# Patient Record
Sex: Female | Born: 1985 | Race: White | Hispanic: No | Marital: Married | Smoking: Never smoker
Health system: Southern US, Community
[De-identification: ages and names within clinical notes are randomized; demographics above are authoritative.]

## PROBLEM LIST (undated history)

## (undated) ENCOUNTER — Inpatient Hospital Stay (HOSPITAL_COMMUNITY): Payer: Self-pay

## (undated) DIAGNOSIS — Z789 Other specified health status: Secondary | ICD-10-CM

## (undated) DIAGNOSIS — E282 Polycystic ovarian syndrome: Secondary | ICD-10-CM

## (undated) DIAGNOSIS — E669 Obesity, unspecified: Secondary | ICD-10-CM

## (undated) DIAGNOSIS — K760 Fatty (change of) liver, not elsewhere classified: Secondary | ICD-10-CM

## (undated) HISTORY — PX: WISDOM TOOTH EXTRACTION: SHX21

## (undated) HISTORY — PX: KNEE SURGERY: SHX244

## (undated) HISTORY — PX: APPENDECTOMY: SHX54

## (undated) HISTORY — PX: DILATION AND CURETTAGE OF UTERUS: SHX78

---

## 2012-01-31 ENCOUNTER — Emergency Department (HOSPITAL_COMMUNITY): Payer: 59

## 2012-01-31 ENCOUNTER — Emergency Department (HOSPITAL_COMMUNITY)
Admission: EM | Admit: 2012-01-31 | Discharge: 2012-02-01 | Disposition: A | Discharge status: Home / Self Care | Payer: 59 | Attending: Emergency Medicine | Admitting: Emergency Medicine

## 2012-01-31 ENCOUNTER — Encounter (HOSPITAL_COMMUNITY): Payer: Self-pay | Admitting: Emergency Medicine

## 2012-01-31 DIAGNOSIS — R10811 Right upper quadrant abdominal tenderness: Secondary | ICD-10-CM | POA: Insufficient documentation

## 2012-01-31 DIAGNOSIS — R112 Nausea with vomiting, unspecified: Secondary | ICD-10-CM | POA: Insufficient documentation

## 2012-01-31 DIAGNOSIS — R109 Unspecified abdominal pain: Secondary | ICD-10-CM

## 2012-01-31 LAB — URINALYSIS, ROUTINE W REFLEX MICROSCOPIC
Glucose, UA: NEGATIVE mg/dL
Ketones, ur: NEGATIVE mg/dL
pH: 5.5 (ref 5.0–8.0)

## 2012-01-31 LAB — CBC WITH DIFFERENTIAL/PLATELET
Eosinophils Relative: 1 % (ref 0–5)
HCT: 41.6 % (ref 36.0–46.0)
Lymphocytes Relative: 25 % (ref 12–46)
Lymphs Abs: 2.5 10*3/uL (ref 0.7–4.0)
MCH: 29.8 pg (ref 26.0–34.0)
MCV: 83.2 fL (ref 78.0–100.0)
Monocytes Absolute: 0.6 10*3/uL (ref 0.1–1.0)
Monocytes Relative: 6 % (ref 3–12)
RBC: 5 MIL/uL (ref 3.87–5.11)
WBC: 10.1 10*3/uL (ref 4.0–10.5)

## 2012-01-31 LAB — URINE MICROSCOPIC-ADD ON

## 2012-01-31 LAB — BASIC METABOLIC PANEL
CO2: 30 mEq/L (ref 19–32)
Chloride: 101 mEq/L (ref 96–112)
Glucose, Bld: 85 mg/dL (ref 70–99)
Sodium: 140 mEq/L (ref 135–145)

## 2012-01-31 MED ORDER — ONDANSETRON HCL 4 MG/2ML IJ SOLN
4.0000 mg | Freq: Once | INTRAMUSCULAR | Status: AC
  Administered 2012-01-31: 4 mg via INTRAVENOUS
  Filled 2012-01-31: qty 2

## 2012-01-31 MED ORDER — MORPHINE SULFATE 4 MG/ML IJ SOLN
4.0000 mg | Freq: Once | INTRAMUSCULAR | Status: AC
  Administered 2012-01-31: 4 mg via INTRAVENOUS
  Filled 2012-01-31: qty 1

## 2012-01-31 MED ORDER — FAMOTIDINE IN NACL 20-0.9 MG/50ML-% IV SOLN
20.0000 mg | Freq: Once | INTRAVENOUS | Status: AC
  Administered 2012-01-31: 20 mg via INTRAVENOUS
  Filled 2012-01-31: qty 50

## 2012-01-31 NOTE — ED Provider Notes (Addendum)
History     CSN: [id number]  Arrival date & time 01/31/12  2002   First MD Initiated Contact with Patient 01/31/12 2220      Chief Complaint  Patient presents with  . Abdominal Pain    (Consider location/radiation/quality/duration/timing/severity/associated sxs/prior treatment) HPI Comments: Pt comes in with cc of abd pain, RUQ. Pt had intermittent pain in the RUQ for the past 2 days, but today the pain has ben constant, and is radiating to the back She has no medical hx, and there is hx of appendectomy. The pain is sharp and throbbing and there is associated nausea and emesis. There is no hx of alcohol abuse. Also, there is no hx of gastritis. There are several family members with cholelithiasic in the family. Pt is not pregnant.  Patient is a 26 y.o. female presenting with abdominal pain. The history is provided by the patient.  Abdominal Pain The primary symptoms of the illness include abdominal pain, nausea and vomiting. The primary symptoms of the illness do not include shortness of breath, diarrhea, dysuria, vaginal discharge or vaginal bleeding.  Symptoms associated with the illness do not include constipation or hematuria.    History reviewed. No pertinent past medical history.  Past Surgical History  Procedure Date  . Appendectomy   . Knee surgery   . Wisdom tooth extraction     No family history on file.  History  Substance Use Topics  . Smoking status: Never Smoker   . Smokeless tobacco: Not on file  . Alcohol Use: No    OB History    Grav Para Term Preterm Abortions TAB SAB Ect Mult Living                  Review of Systems  Constitutional: Positive for activity change.  HENT: Negative for facial swelling and neck pain.   Respiratory: Negative for cough, shortness of breath and wheezing.   Cardiovascular: Negative for chest pain.  Gastrointestinal: Positive for nausea, vomiting and abdominal pain. Negative for diarrhea, constipation, blood in stool and  abdominal distention.  Genitourinary: Negative for dysuria, hematuria, vaginal bleeding, vaginal discharge and difficulty urinating.  Skin: Negative for color change.  Neurological: Negative for speech difficulty and headaches.  Hematological: Does not bruise/bleed easily.  Psychiatric/Behavioral: Negative for confusion.    Allergies  Review of patient's allergies indicates no known allergies.  Home Medications   Current Outpatient Rx  Name Route Sig Dispense Refill  . ACETAMINOPHEN-CODEINE #3 300-30 MG PO TABS Oral Take 1 tablet by mouth every 4 (four) hours as needed. For pain    . BISMUTH SUBSALICYLATE 262 MG/15ML PO SUSP Oral Take 30 mLs by mouth every 6 (six) hours as needed. For nausea      BP 92/56  Pulse 61  Temp 98 F (36.7 C) (Oral)  Resp 18  SpO2 99%  LMP 01/29/2012  Physical Exam  Constitutional: She is oriented to person, place, and time. She appears well-developed and well-nourished.  HENT:  Head: Normocephalic and atraumatic.  Eyes: Conjunctivae and EOM are normal. Pupils are equal, round, and reactive to light.  Neck: Normal range of motion. Neck supple.  Cardiovascular: Normal rate, regular rhythm, normal heart sounds and intact distal pulses.   No murmur heard. Pulmonary/Chest: Effort normal and breath sounds normal. No respiratory distress. She has no wheezes.  Abdominal: Soft. Bowel sounds are normal. She exhibits no distension. There is tenderness. There is no rebound and no guarding.       RUQ  tenderness with voluntary guarding  Genitourinary: Vagina normal and uterus normal.       External exam - normal, no lesions Speculum exam: Pt has someno discharge, + blood per cervix Bimanual exam: Patient has no CMT, no adnexal tenderness or fullness and cervical os is less than a finger width open  Neurological: She is alert and oriented to person, place, and time.  Skin: Skin is warm and dry.    ED Course  Procedures (including critical care time)  Labs  Reviewed  URINALYSIS, ROUTINE W REFLEX MICROSCOPIC - Abnormal; Notable for the following:    Color, Urine AMBER (*)  BIOCHEMICALS MAY BE AFFECTED BY COLOR   APPearance CLOUDY (*)     Hgb urine dipstick LARGE (*)     Leukocytes, UA SMALL (*)     All other components within normal limits  BASIC METABOLIC PANEL - Abnormal; Notable for the following:    GFR calc non Af Amer 81 (*)     All other components within normal limits  URINE MICROSCOPIC-ADD ON - Abnormal; Notable for the following:    Squamous Epithelial / LPF FEW (*)     All other components within normal limits  CBC WITH DIFFERENTIAL  APTT  BASIC METABOLIC PANEL  CBC WITH DIFFERENTIAL  HEPATIC FUNCTION PANEL  PROTIME-INR  LIPASE, BLOOD  PREGNANCY, URINE   No results found.   No diagnosis found.    MDM  DDx includes: Pancreatitis Hepatobiliary pathology including cholecystitis Gastritis/PUD SBO ACS syndrome Aortic Dissection Renal stones  A/P: Pt comes in with cc of RUQ pain. Pt's exam is consistent with cholelithiasis and so is the hx - as the pain is reproduced by po intake and there is associated nausea ans emesis.  We will get [place] RUQ. Pt has some back pain radiating from the RUQ, if the [place] is negative, we will consider getting a CT renal stone protocol.    Derwood Kaplan, MD 02/01/12 2956  Derwood Kaplan, MD 02/01/12 2130

## 2012-01-31 NOTE — ED Notes (Signed)
PT. REPORTS RUQ ABDOMINAL PAIN WITH VOMITTING AND DIARRHEA , SLIGHT CHILLS ONSET,LAST Saturday .

## 2012-02-01 LAB — BASIC METABOLIC PANEL
BUN: 8 mg/dL (ref 6–23)
Calcium: 10 mg/dL (ref 8.4–10.5)
Chloride: 100 mEq/L (ref 96–112)
GFR calc non Af Amer: 83 mL/min — ABNORMAL LOW (ref >90)
Potassium: 3.8 mEq/L (ref 3.5–5.1)
Sodium: 140 mEq/L (ref 135–145)

## 2012-02-01 LAB — PROTIME-INR
INR: 0.9 (ref 0.00–1.49)
Prothrombin Time: 12.3 seconds (ref 11.6–15.2)

## 2012-02-01 LAB — APTT: aPTT: 31 seconds (ref 24–37)

## 2012-02-01 LAB — HEPATIC FUNCTION PANEL
AST: 21 U/L (ref 0–37)
Alkaline Phosphatase: 87 U/L (ref 39–117)
Bilirubin, Direct: <0.1 mg/dL (ref 0.0–0.3)
Total Bilirubin: 0.2 mg/dL — ABNORMAL LOW (ref 0.3–1.2)

## 2012-02-01 MED ORDER — HYDROCODONE-ACETAMINOPHEN 5-325 MG PO TABS: 1.0000 | ORAL_TABLET | ORAL | Status: AC | PRN

## 2012-02-01 MED ORDER — ONDANSETRON HCL 4 MG PO TABS: 8.0000 mg | ORAL_TABLET | Freq: Four times a day (QID) | ORAL | Status: AC

## 2012-02-01 MED ORDER — MORPHINE SULFATE 4 MG/ML IJ SOLN
4.0000 mg | Freq: Once | INTRAMUSCULAR | Status: AC
  Administered 2012-02-01: 4 mg via INTRAVENOUS
  Filled 2012-02-01: qty 1

## 2012-02-01 MED ORDER — IBUPROFEN 600 MG PO TABS: 600.0000 mg | ORAL_TABLET | Freq: Four times a day (QID) | ORAL | Status: AC | PRN

## 2012-02-01 MED ORDER — ONDANSETRON HCL 4 MG/2ML IJ SOLN
4.0000 mg | Freq: Once | INTRAMUSCULAR | Status: AC
  Administered 2012-02-01: 4 mg via INTRAVENOUS
  Filled 2012-02-01: qty 2

## 2012-02-01 NOTE — ED Notes (Signed)
Patient transported to X-ray 

## 2012-07-30 ENCOUNTER — Ambulatory Visit (HOSPITAL_COMMUNITY)
Admission: RE | Admit: 2012-07-30 | Discharge: 2012-07-30 | Disposition: A | Discharge status: Home / Self Care | Payer: 59 | Source: Ambulatory Visit | Attending: Obstetrics & Gynecology | Admitting: Obstetrics & Gynecology

## 2012-07-30 LAB — ABO/RH: ABO/RH(D): O NEG

## 2012-08-08 ENCOUNTER — Encounter (HOSPITAL_COMMUNITY): Payer: Self-pay | Admitting: *Deleted

## 2012-08-08 ENCOUNTER — Inpatient Hospital Stay (HOSPITAL_COMMUNITY): Payer: 59

## 2012-08-08 ENCOUNTER — Inpatient Hospital Stay (HOSPITAL_COMMUNITY)
Admission: AD | Admit: 2012-08-08 | Discharge: 2012-08-09 | Disposition: A | Discharge status: Home / Self Care | Payer: 59 | Source: Ambulatory Visit | Attending: Obstetrics and Gynecology | Admitting: Obstetrics and Gynecology

## 2012-08-08 DIAGNOSIS — O209 Hemorrhage in early pregnancy, unspecified: Secondary | ICD-10-CM | POA: Insufficient documentation

## 2012-08-08 DIAGNOSIS — O200 Threatened abortion: Secondary | ICD-10-CM

## 2012-08-08 HISTORY — DX: Other specified health status: Z78.9

## 2012-08-08 LAB — CBC WITH DIFFERENTIAL/PLATELET
Basophils Absolute: 0 K/uL (ref 0.0–0.1)
Basophils Relative: 0 % (ref 0–1)
Eosinophils Absolute: 0.1 K/uL (ref 0.0–0.7)
Eosinophils Relative: 1 % (ref 0–5)
HCT: 36.4 % (ref 36.0–46.0)
Hemoglobin: 12.6 g/dL (ref 12.0–15.0)
Lymphocytes Relative: 35 % (ref 12–46)
Lymphs Abs: 2.6 K/uL (ref 0.7–4.0)
MCH: 28.6 pg (ref 26.0–34.0)
MCHC: 34.6 g/dL (ref 30.0–36.0)
MCV: 82.7 fL (ref 78.0–100.0)
Monocytes Absolute: 0.5 K/uL (ref 0.1–1.0)
Monocytes Relative: 6 % (ref 3–12)
Neutro Abs: 4.4 K/uL (ref 1.7–7.7)
Neutrophils Relative %: 59 % (ref 43–77)
Platelets: 200 K/uL (ref 150–400)
RBC: 4.4 MIL/uL (ref 3.87–5.11)
RDW: 12.9 % (ref 11.5–15.5)
WBC: 7.6 K/uL (ref 4.0–10.5)

## 2012-08-08 NOTE — MAU Provider Note (Signed)
History     CSN: [id number]  Arrival date and time: 08/08/12 2141   None     No chief complaint on file.  HPI Zi Sek is a 27 y.o. female @ [redacted]w[redacted]d gestation who presents to MAU with vaginal bleeding. The bleeding has been of and on since 07/23/12.  She describes the bleeding as bright red tonight and more than it had been. Called the nurse line and told to come in for evaluation. Associated symptoms include mild cramping and nausea. Last sexual intercourse more than one week ago. The history was provided by the patient.   OB History    Grav Para Term Preterm Abortions TAB SAB Ect Mult Living   3    2  2    0      Past Medical History  Diagnosis Date  . No pertinent past medical history     Past Surgical History  Procedure Date  . Appendectomy   . Knee surgery   . Wisdom tooth extraction   . Dilation and curettage of uterus     Family History  Problem Relation Age of Onset  . Diabetes Mother   . Cancer Mother   . Hypothyroidism Mother   . Diabetes Father   . Cancer Father     History  Substance Use Topics  . Smoking status: Never Smoker   . Smokeless tobacco: Not on file  . Alcohol Use: No    Allergies: No Known Allergies  Prescriptions prior to admission  Medication Sig Dispense Refill  . Prenatal Vit-Fe Fumarate-FA (MULTIVITAMIN-PRENATAL) 27-0.8 MG TABS Take 1 tablet by mouth daily.      . promethazine (PHENERGAN) 12.5 MG tablet Take 12.5 mg by mouth every 6 (six) hours as needed.      Marland Kitchen acetaminophen-codeine (TYLENOL #3) 300-30 MG per tablet Take 1 tablet by mouth every 4 (four) hours as needed. For pain      . bismuth subsalicylate (PEPTO BISMOL) 262 MG/15ML suspension Take 30 mLs by mouth every 6 (six) hours as needed. For nausea        Review of Systems  Constitutional: Negative for fever, chills and weight loss.  HENT: Negative for ear pain, nosebleeds, congestion, sore throat and neck pain.   Eyes: Negative for blurred vision, double vision,  photophobia and pain.  Respiratory: Negative for cough, shortness of breath and wheezing.   Cardiovascular: Negative for chest pain, palpitations and leg swelling.  Gastrointestinal: Positive for nausea, vomiting and abdominal pain (mild cramping). Negative for heartburn, diarrhea and constipation.  Genitourinary: Positive for frequency. Negative for dysuria and urgency.  Musculoskeletal: Negative for myalgias and back pain.  Skin: Negative for itching and rash.  Neurological: Positive for headaches. Negative for dizziness, sensory change, speech change, seizures and weakness.  Endo/Heme/Allergies: Does not bruise/bleed easily.  Psychiatric/Behavioral: Negative for depression and substance abuse. The patient is not nervous/anxious.    Physical Exam   Blood pressure 102/68, pulse 84, temperature 98.1 F (36.7 C), temperature source Oral, resp. rate 20, last menstrual period 07/01/2012, unknown if currently breastfeeding.  Physical Exam  Nursing note and vitals reviewed. Constitutional: She is oriented to person, place, and time. She appears well-developed and well-nourished. No distress.  HENT:  Head: Normocephalic and atraumatic.  Eyes: EOM are normal.  Neck: Neck supple.  Cardiovascular: Normal rate.   Respiratory: Effort normal.  GI: Soft. There is no tenderness.  Genitourinary:       External genitalia without lesions. Small blood vaginal vault. Cervix long, closed,  no CMT, no adnexal tenderness. Unable to palpate uterus due to patient habitus.  Musculoskeletal: Normal range of motion.  Neurological: She is alert and oriented to person, place, and time.  Skin: Skin is warm and dry.  Psychiatric: She has a normal mood and affect. Her behavior is normal. Judgment and thought content normal.   Procedures US Ob Comp Less 14 Wks  08/09/2012  *RADIOLOGY REPORT*  Clinical Data: Bleeding and cramping during early pregnancy. Estimated gestational age by LMP is 5 weeks 3 days although  dates are unsure.  Quantitative beta HCG is 46,154.  OBSTETRIC <14 WK [place] AND TRANSVAGINAL OB US  Technique:  Both transabdominal and transvaginal ultrasound examinations were performed for complete evaluation of the gestation as well as the maternal uterus, adnexal regions, and pelvic cul-de-sac.  Transvaginal technique was performed to assess early pregnancy.  Comparison:  None.  Intrauterine gestational sac:  The endometrium is thickened and heterogeneous in appearance with 2 separate fluid collections demonstrated.  On the left, there is a more rounded fluid collection possibly representing a gestational sac with a mean sac diameter of 1.64 cm.  In the right endometrium, there is a more irregular collection measuring 1.7 x 0.8 cm.  This could represent residual or developing endometrial fluid, an additional irregular gestational sac, or subchorionic hemorrhage. Yolk sac: The yolk sac is not visualized. Embryo: The fetal pole is not visualized. Cardiac Activity: Fetal cardiac activity is not visualized.  MSD: 1.64 mm  6 w 4 d         [place] EDC: 03/30/2013  Maternal uterus/adnexae: No myometrial masses are otherwise identified.  Myometrial thickening anterior probably represents contraction.  The uterus appears to be retroverted.  The right ovary measures 3.3 x 2.4 x 2.1 cm.  Normal follicular changes.  The left ovary measures 2.2 x 2 x 1.8 cm.  Normal follicular changes.  No abnormal adnexal masses.  No free pelvic fluid collections.  IMPRESSION: Probable endometrial gestational sac with mean sac diameter consistent with estimated gestational age [redacted] weeks 4 days.  Fetal pole and yolk sac are not visualized.  This may be due to early pregnancy although a blighted ovum is not excluded. Irregular of additional fluid collection in the endometrium may represent additional irregular gestational sac, fluid, or subchorionic hemorrhage.   Follow up ultrasound is recommended in 10 days.   Original Report Authenticated By:  Burman Nieves, M.D.    US Ob Transvaginal  08/09/2012  *RADIOLOGY REPORT*  Clinical Data: Bleeding and cramping during early pregnancy. Estimated gestational age by LMP is 5 weeks 3 days although dates are unsure.  Quantitative beta HCG is 46,154.  OBSTETRIC <14 WK [place] AND TRANSVAGINAL OB US  Technique:  Both transabdominal and transvaginal ultrasound examinations were performed for complete evaluation of the gestation as well as the maternal uterus, adnexal regions, and pelvic cul-de-sac.  Transvaginal technique was performed to assess early pregnancy.  Comparison:  None.  Intrauterine gestational sac:  The endometrium is thickened and heterogeneous in appearance with 2 separate fluid collections demonstrated.  On the left, there is a more rounded fluid collection possibly representing a gestational sac with a mean sac diameter of 1.64 cm.  In the right endometrium, there is a more irregular collection measuring 1.7 x 0.8 cm.  This could represent residual or developing endometrial fluid, an additional irregular gestational sac, or subchorionic hemorrhage. Yolk sac: The yolk sac is not visualized. Embryo: The fetal pole is not visualized. Cardiac Activity: Fetal cardiac activity  is not visualized.  MSD: 1.64 mm  6 w 4 d         [place] EDC: 03/30/2013  Maternal uterus/adnexae: No myometrial masses are otherwise identified.  Myometrial thickening anterior probably represents contraction.  The uterus appears to be retroverted.  The right ovary measures 3.3 x 2.4 x 2.1 cm.  Normal follicular changes.  The left ovary measures 2.2 x 2 x 1.8 cm.  Normal follicular changes.  No abnormal adnexal masses.  No free pelvic fluid collections.  IMPRESSION: Probable endometrial gestational sac with mean sac diameter consistent with estimated gestational age [redacted] weeks 4 days.  Fetal pole and yolk sac are not visualized.  This may be due to early pregnancy although a blighted ovum is not excluded. Irregular of additional fluid  collection in the endometrium may represent additional irregular gestational sac, fluid, or subchorionic hemorrhage.   Follow up ultrasound is recommended in 10 days.   Original Report Authenticated By: Burman Nieves, M.D.     Assessment: 27 y.o. female @ [redacted]w[redacted]d gestation with vaginal bleeding   Possible failed pregnancy  Plan:  Discussed with Dr. Arelia Sneddon, will have patient follow up in the office on Monday to repeat the ultrasound. She will return here for any problems before then.  I have reviewed this patient's vital signs, nurses notes, appropriate labs and imaging.  I have discussed results with the patient and plan of care. Patient voices understanding.          Latrish Mogel, RN, FNP, [facility] 08/08/2012, 10:45 PM

## 2012-08-09 ENCOUNTER — Encounter (HOSPITAL_COMMUNITY): Payer: Self-pay

## 2012-08-09 LAB — GC/CHLAMYDIA PROBE AMP
CT Probe RNA: NEGATIVE
GC Probe RNA: NEGATIVE

## 2012-08-09 LAB — WET PREP, GENITAL: Trich, Wet Prep: NONE SEEN

## 2012-08-20 ENCOUNTER — Encounter (HOSPITAL_COMMUNITY): Payer: Self-pay | Admitting: *Deleted

## 2012-08-21 NOTE — H&P (Signed)
Rita Benton is an 27 y.o. female with missed AB; failed medical management.  Blood type Rh negative.    Pertinent Gynecological History: Menses: flow is moderate Bleeding: n/a Contraception: none DES exposure: unknown Blood transfusions: none Sexually transmitted diseases: no past history Previous GYN Procedures: DNC  Last mammogram: n/a Date: n/a Last pap: n/a Date: n/a  OB History: G3, P0  Menstrual History: Menarche age: 52 Patient's last menstrual period was 07/01/2012.    Past Medical History  Diagnosis Date  . No pertinent past medical history     Past Surgical History  Procedure Laterality Date  . Appendectomy    . Knee surgery    . Wisdom tooth extraction    . Dilation and curettage of uterus      Family History  Problem Relation Age of Onset  . Diabetes Mother   . Cancer Mother   . Hypothyroidism Mother   . Diabetes Father   . Cancer Father     Social History:  reports that she has never smoked. She does not have any smokeless tobacco history on file. She reports that she does not drink alcohol or use illicit drugs.  Allergies: No Known Allergies  No prescriptions prior to admission    ROS  Last menstrual period 07/01/2012, unknown if currently breastfeeding. Physical Exam  Constitutional: She is oriented to person, place, and time. She appears well-developed and well-nourished.  Cardiovascular: Normal rate and regular rhythm.   Respiratory: Effort normal.  GI: Soft. She exhibits no distension.  Neurological: She is alert and oriented to person, place, and time.  Skin: Skin is warm and dry.  Psychiatric: She has a normal mood and affect. Her behavior is normal.    No results found for this or any previous visit (from the past 24 hour(s)).  No results found.  Assessment/Plan: 27 yo G3P0 with missed AB Plan for D&C  Rita Benton 08/21/2012, 8:18 PM

## 2012-08-22 ENCOUNTER — Encounter (HOSPITAL_COMMUNITY): Payer: Self-pay | Admitting: *Deleted

## 2012-08-22 ENCOUNTER — Encounter (HOSPITAL_COMMUNITY)
Admission: RE | Disposition: A | Discharge status: Home / Self Care | Payer: Self-pay | Source: Ambulatory Visit | Attending: Obstetrics & Gynecology

## 2012-08-22 ENCOUNTER — Ambulatory Visit (HOSPITAL_COMMUNITY): Payer: 59 | Admitting: Anesthesiology

## 2012-08-22 ENCOUNTER — Encounter (HOSPITAL_COMMUNITY): Payer: Self-pay | Admitting: Anesthesiology

## 2012-08-22 ENCOUNTER — Ambulatory Visit (HOSPITAL_COMMUNITY)
Admission: RE | Admit: 2012-08-22 | Discharge: 2012-08-22 | Disposition: A | Discharge status: Home / Self Care | Payer: 59 | Source: Ambulatory Visit | Attending: Obstetrics & Gynecology | Admitting: Obstetrics & Gynecology

## 2012-08-22 DIAGNOSIS — O021 Missed abortion: Secondary | ICD-10-CM | POA: Insufficient documentation

## 2012-08-22 HISTORY — PX: DILATION AND EVACUATION: SHX1459

## 2012-08-22 SURGERY — DILATION AND EVACUATION, UTERUS
Anesthesia: Monitor Anesthesia Care | Site: Vagina | Wound class: Clean Contaminated

## 2012-08-22 MED ORDER — FENTANYL CITRATE 0.05 MG/ML IJ SOLN
25.0000 ug | INTRAMUSCULAR | Status: DC | PRN
  Administered 2012-08-22: 25 ug via INTRAVENOUS

## 2012-08-22 MED ORDER — ONDANSETRON HCL 4 MG/2ML IJ SOLN
INTRAMUSCULAR | Status: AC
  Filled 2012-08-22: qty 2

## 2012-08-22 MED ORDER — KETOROLAC TROMETHAMINE 30 MG/ML IJ SOLN
INTRAMUSCULAR | Status: AC
  Filled 2012-08-22: qty 1

## 2012-08-22 MED ORDER — LACTATED RINGERS IV SOLN
INTRAVENOUS | Status: DC
  Administered 2012-08-22: 12:00:00 via INTRAVENOUS

## 2012-08-22 MED ORDER — METRONIDAZOLE 500 MG PO TABS: 500.0000 mg | ORAL_TABLET | Freq: Two times a day (BID) | ORAL | Status: AC

## 2012-08-22 MED ORDER — SCOPOLAMINE 1 MG/3DAYS TD PT72
MEDICATED_PATCH | TRANSDERMAL | Status: AC
  Filled 2012-08-22: qty 1

## 2012-08-22 MED ORDER — DOXYCYCLINE HYCLATE 100 MG PO TABS: 100.0000 mg | ORAL_TABLET | Freq: Every day | ORAL | Status: DC

## 2012-08-22 MED ORDER — FENTANYL CITRATE 0.05 MG/ML IJ SOLN
INTRAMUSCULAR | Status: AC
  Administered 2012-08-22: 25 ug via INTRAVENOUS
  Filled 2012-08-22: qty 2

## 2012-08-22 MED ORDER — OXYCODONE-ACETAMINOPHEN 5-325 MG PO TABS: 1.0000 | ORAL_TABLET | ORAL | Status: DC | PRN

## 2012-08-22 MED ORDER — PROPOFOL 10 MG/ML IV EMUL
INTRAVENOUS | Status: AC
  Filled 2012-08-22: qty 20

## 2012-08-22 MED ORDER — LIDOCAINE HCL (CARDIAC) 20 MG/ML IV SOLN
INTRAVENOUS | Status: AC
  Filled 2012-08-22: qty 5

## 2012-08-22 MED ORDER — RHO D IMMUNE GLOBULIN 300 MCG IM INJ: 300.0000 ug | INJECTION | Freq: Once | INTRAMUSCULAR | Status: DC

## 2012-08-22 MED ORDER — GLYCOPYRROLATE 0.2 MG/ML IJ SOLN
INTRAMUSCULAR | Status: AC
  Filled 2012-08-22: qty 1

## 2012-08-22 MED ORDER — DEXTROSE IN LACTATED RINGERS 5 % IV SOLN: INTRAVENOUS | Status: DC

## 2012-08-22 MED ORDER — MIDAZOLAM HCL 5 MG/5ML IJ SOLN
INTRAMUSCULAR | Status: DC | PRN
  Administered 2012-08-22: 2 mg via INTRAVENOUS

## 2012-08-22 MED ORDER — GLYCOPYRROLATE 0.2 MG/ML IJ SOLN
INTRAMUSCULAR | Status: DC | PRN
  Administered 2012-08-22: 0.2 mg via INTRAVENOUS

## 2012-08-22 MED ORDER — DEXAMETHASONE SODIUM PHOSPHATE 10 MG/ML IJ SOLN
INTRAMUSCULAR | Status: AC
  Filled 2012-08-22: qty 1

## 2012-08-22 MED ORDER — CHLOROPROCAINE HCL 1 % IJ SOLN
INTRAMUSCULAR | Status: AC
  Filled 2012-08-22: qty 30

## 2012-08-22 MED ORDER — DEXAMETHASONE SODIUM PHOSPHATE 4 MG/ML IJ SOLN
INTRAMUSCULAR | Status: DC | PRN
  Administered 2012-08-22: 10 mg via INTRAVENOUS

## 2012-08-22 MED ORDER — SODIUM CHLORIDE 0.9 % IV SOLN
INTRAVENOUS | Status: DC
  Administered 2012-08-22: 17:00:00 via INTRAVENOUS

## 2012-08-22 MED ORDER — OXYCODONE-ACETAMINOPHEN 7.5-325 MG PO TABS: 1.0000 | ORAL_TABLET | ORAL | Status: DC | PRN

## 2012-08-22 MED ORDER — FENTANYL CITRATE 0.05 MG/ML IJ SOLN
INTRAMUSCULAR | Status: DC | PRN
  Administered 2012-08-22: 100 ug via INTRAVENOUS
  Administered 2012-08-22 (×3): 50 ug via INTRAVENOUS

## 2012-08-22 MED ORDER — LIDOCAINE HCL (CARDIAC) 20 MG/ML IV SOLN
INTRAVENOUS | Status: DC | PRN
  Administered 2012-08-22: 50 mg via INTRAVENOUS

## 2012-08-22 MED ORDER — KETOROLAC TROMETHAMINE 30 MG/ML IJ SOLN
INTRAMUSCULAR | Status: DC | PRN
  Administered 2012-08-22: 30 mg via INTRAVENOUS

## 2012-08-22 MED ORDER — ONDANSETRON HCL 4 MG/2ML IJ SOLN
INTRAMUSCULAR | Status: DC | PRN
  Administered 2012-08-22: 4 mg via INTRAVENOUS

## 2012-08-22 MED ORDER — RHO D IMMUNE GLOBULIN 1500 UNIT/2ML IJ SOLN
300.0000 ug | Freq: Once | INTRAMUSCULAR | Status: AC
  Administered 2012-08-22: 300 ug via INTRAMUSCULAR

## 2012-08-22 MED ORDER — CHLOROPROCAINE HCL 1 % IJ SOLN: INTRAMUSCULAR | Status: DC | PRN

## 2012-08-22 MED ORDER — MIDAZOLAM HCL 2 MG/2ML IJ SOLN
INTRAMUSCULAR | Status: AC
  Filled 2012-08-22: qty 2

## 2012-08-22 MED ORDER — OXYCODONE-ACETAMINOPHEN 5-325 MG PO TABS
ORAL_TABLET | ORAL | Status: AC
  Administered 2012-08-22: 1 via ORAL
  Filled 2012-08-22: qty 1

## 2012-08-22 MED ORDER — KETOROLAC TROMETHAMINE 30 MG/ML IJ SOLN: 15.0000 mg | Freq: Once | INTRAMUSCULAR | Status: DC | PRN

## 2012-08-22 MED ORDER — FENTANYL CITRATE 0.05 MG/ML IJ SOLN
INTRAMUSCULAR | Status: AC
  Filled 2012-08-22: qty 5

## 2012-08-22 MED ORDER — PROPOFOL 10 MG/ML IV EMUL
INTRAVENOUS | Status: DC | PRN
  Administered 2012-08-22: 10 mg via INTRAVENOUS
  Administered 2012-08-22: 20 mg via INTRAVENOUS
  Administered 2012-08-22 (×2): 10 mg via INTRAVENOUS
  Administered 2012-08-22 (×3): 20 mg via INTRAVENOUS
  Administered 2012-08-22 (×10): 10 mg via INTRAVENOUS
  Administered 2012-08-22: 20 mg via INTRAVENOUS
  Administered 2012-08-22: 10 mg via INTRAVENOUS

## 2012-08-22 SURGICAL SUPPLY — 20 items
CATH ROBINSON RED A/P 16FR (CATHETERS) ×2 IMPLANT
CLOTH BEACON ORANGE TIMEOUT ST (SAFETY) ×2 IMPLANT
DECANTER SPIKE VIAL GLASS SM (MISCELLANEOUS) ×2 IMPLANT
GLOVE BIO SURGEON STRL SZ 6 (GLOVE) ×2 IMPLANT
GLOVE BIOGEL PI IND STRL 6 (GLOVE) ×2 IMPLANT
GLOVE BIOGEL PI INDICATOR 6 (GLOVE) ×2
GOWN STRL REIN XL XLG (GOWN DISPOSABLE) ×4 IMPLANT
KIT BERKELEY 1ST TRIMESTER 3/8 (MISCELLANEOUS) ×2 IMPLANT
NEEDLE SPNL 22GX3.5 QUINCKE BK (NEEDLE) ×2 IMPLANT
NS IRRIG 1000ML POUR BTL (IV SOLUTION) ×2 IMPLANT
PACK VAGINAL MINOR WOMEN LF (CUSTOM PROCEDURE TRAY) ×2 IMPLANT
PAD OB MATERNITY 4.3X12.25 (PERSONAL CARE ITEMS) ×2 IMPLANT
PAD PREP 24X48 CUFFED NSTRL (MISCELLANEOUS) ×2 IMPLANT
SET BERKELEY SUCTION TUBING (SUCTIONS) ×2 IMPLANT
SYR CONTROL 10ML LL (SYRINGE) ×2 IMPLANT
TOWEL OR 17X24 6PK STRL BLUE (TOWEL DISPOSABLE) ×4 IMPLANT
VACURETTE 10 RIGID CVD (CANNULA) IMPLANT
VACURETTE 7MM CVD STRL WRAP (CANNULA) IMPLANT
VACURETTE 8 RIGID CVD (CANNULA) IMPLANT
VACURETTE 9 RIGID CVD (CANNULA) IMPLANT

## 2012-08-22 NOTE — Progress Notes (Signed)
No updates to H&P.  Counseled for D&C and all questions answered.    Saman Umstead,DO

## 2012-08-22 NOTE — Op Note (Signed)
PREOPERATIVE DIAGNOSIS:  Missed abortion at 6 weeks POSTOPERATIVE DIAGNOSIS: The same PROCEDURE: Dilation and Curettage. SURGEON:  Dr. Mitchel Honour   INDICATIONS: 27 y.o. G3P0030  here for scheduled surgery for missed abortion at 6 weeks.   Risks of surgery were discussed with the patient including but not limited to: bleeding which may require transfusion; infection which may require antibiotics; injury to uterus or surrounding organs; intrauterine scarring which may impair future fertility; need for additional procedures including laparotomy or laparoscopy; and other postoperative/anesthesia complications. Written informed consent was obtained.    FINDINGS:  An 8 week size uterus.    ANESTHESIA:   General ESTIMATED BLOOD LOSS:  150 ml SPECIMENS:  POC COMPLICATIONS:  None immediate.  PROCEDURE DETAILS:  The patient was taken to the operating room where general anesthesia was administered and was found to be adequate.  After an adequate timeout was performed, she was placed in the dorsal lithotomy position and examined; then prepped and draped in the sterile manner.   Her bladder was catheterized for an unmeasured amount of clear, yellow urine. A speculum was then placed in the patient's vagina and a single tooth tenaculum was applied to the anterior lip of the cervix. The uteus was sounded to 12 cm and dilated manually with metal dilators to accommodate the 8 Fr suction curette.  Once the cervix was dilated, the suction curettage was performed x 2 passes which produced tissue.  A sharp curettage was then performed to obtain a scant amount of curettings; a gritty texture was appreciated circumferentially.  A final pass with the suction curette was taken which returned scant blood alone.  The tenaculum was removed from the anterior lip of the cervix and the vaginal speculum was removed after noting good hemostasis.  The patient tolerated the procedure well and was taken to the recovery area awake,  extubated and in stable condition.

## 2012-08-22 NOTE — Transfer of Care (Signed)
Immediate Anesthesia Transfer of Care Note  Patient: Rita Benton  Procedure(s) Performed: Procedure(s): DILATATION AND EVACUATION (N/A)  Patient Location: PACU  Anesthesia Type:MAC  Level of Consciousness: awake and patient cooperative  Airway & Oxygen Therapy: Patient Spontanous Breathing and Patient connected to nasal cannula oxygen  Post-op Assessment: Report given to PACU RN and Post -op Vital signs reviewed and stable  Post vital signs: stable  Complications: No apparent anesthesia complications

## 2012-08-22 NOTE — Anesthesia Preprocedure Evaluation (Signed)
Anesthesia Evaluation  Patient identified by MRN, date of birth, ID band Patient awake    Reviewed: Allergy & Precautions, H&P , NPO status , Patient's Chart, lab work & pertinent test results, reviewed documented beta blocker date and time   History of Anesthesia Complications (+) PONV  Airway Mallampati: II TM Distance: >3 FB Neck ROM: full    Dental  (+) Teeth Intact   Pulmonary neg pulmonary ROS,  breath sounds clear to auscultation        Cardiovascular negative cardio ROS  Rhythm:regular Rate:Normal     Neuro/Psych negative neurological ROS  negative psych ROS   GI/Hepatic negative GI ROS, Neg liver ROS,   Endo/Other  negative endocrine ROS  Renal/GU negative Renal ROS  negative genitourinary   Musculoskeletal   Abdominal   Peds  Hematology negative hematology ROS (+)   Anesthesia Other Findings   Reproductive/Obstetrics (+) Pregnancy (8 weeks missed ab)                           Anesthesia Physical Anesthesia Plan  ASA: II  Anesthesia Plan: MAC   Post-op Pain Management:    Induction:   Airway Management Planned:   Additional Equipment:   Intra-op Plan:   Post-operative Plan:   Informed Consent: I have reviewed the patients History and Physical, chart, labs and discussed the procedure including the risks, benefits and alternatives for the proposed anesthesia with the patient or authorized representative who has indicated his/her understanding and acceptance.     Plan Discussed with: Surgeon and CRNA  Anesthesia Plan Comments:         Anesthesia Quick Evaluation

## 2012-08-22 NOTE — Anesthesia Postprocedure Evaluation (Signed)
  Anesthesia Post Note  Patient: Rita Benton  Procedure(s) Performed: Procedure(s) (LRB): DILATATION AND EVACUATION (N/A)  Anesthesia type: MAC  Patient location: PACU  Post pain: Pain level controlled  Post assessment: Post-op Vital signs reviewed  Last Vitals:  Filed Vitals:   08/22/12 1415  BP: 99/49  Pulse: 58  Temp:   Resp: 16    Post vital signs: Reviewed  Level of consciousness: sedated  Complications: No apparent anesthesia complications

## 2012-08-23 ENCOUNTER — Encounter (HOSPITAL_COMMUNITY): Payer: Self-pay | Admitting: Obstetrics & Gynecology

## 2012-08-23 LAB — RH IG WORKUP (INCLUDES ABO/RH)
Antibody Screen: POSITIVE
DAT, IgG: NEGATIVE

## 2012-08-29 ENCOUNTER — Inpatient Hospital Stay (HOSPITAL_COMMUNITY)
Admission: AD | Admit: 2012-08-29 | Discharge: 2012-08-29 | Disposition: A | Discharge status: Home / Self Care | Payer: 59 | Source: Ambulatory Visit | Attending: Obstetrics and Gynecology | Admitting: Obstetrics and Gynecology

## 2012-08-29 ENCOUNTER — Encounter (HOSPITAL_COMMUNITY): Payer: Self-pay | Admitting: *Deleted

## 2012-08-29 DIAGNOSIS — N939 Abnormal uterine and vaginal bleeding, unspecified: Secondary | ICD-10-CM

## 2012-08-29 DIAGNOSIS — Z9889 Other specified postprocedural states: Secondary | ICD-10-CM

## 2012-08-29 DIAGNOSIS — IMO0002 Reserved for concepts with insufficient information to code with codable children: Secondary | ICD-10-CM | POA: Insufficient documentation

## 2012-08-29 DIAGNOSIS — R109 Unspecified abdominal pain: Secondary | ICD-10-CM | POA: Insufficient documentation

## 2012-08-29 HISTORY — DX: Obesity, unspecified: E66.9

## 2012-08-29 MED ORDER — METHYLERGONOVINE MALEATE 0.2 MG PO TABS: 0.2000 mg | ORAL_TABLET | Freq: Three times a day (TID) | ORAL | Status: DC

## 2012-08-29 MED ORDER — METHYLERGONOVINE MALEATE 0.2 MG PO TABS
0.2000 mg | ORAL_TABLET | ORAL | Status: AC
  Administered 2012-08-29: 0.2 mg via ORAL
  Filled 2012-08-29: qty 1

## 2012-08-29 MED ORDER — HYDROCODONE-ACETAMINOPHEN 5-325 MG PO TABS
2.0000 | ORAL_TABLET | ORAL | Status: AC
  Administered 2012-08-29: 2 via ORAL
  Filled 2012-08-29: qty 2

## 2012-08-29 MED ORDER — HYDROCODONE-ACETAMINOPHEN 5-325 MG PO TABS: 1.0000 | ORAL_TABLET | Freq: Four times a day (QID) | ORAL | Status: DC | PRN

## 2012-08-29 NOTE — Progress Notes (Signed)
Pt states she has heavy bleeding off and on

## 2012-08-29 NOTE — MAU Note (Signed)
Pt states she has passed a 50cent size tissue. Pt states cramping is increasing and  She is having pain in her vaginal area and lower abdomen. Pt states she had a blighted ovum and had to have a d+c. Pt states pain started having pain on Tuesday. Pt states she was told to monitor her bleeding.

## 2012-08-30 NOTE — MAU Provider Note (Signed)
Chief Complaint: Post-op Problem   First Provider Initiated Contact with Patient 08/29/12 2143     SUBJECTIVE HPI: Rita Benton is a 27 y.o. [id number] who presents to maternity admissions 2 weeks post D&C reporting abdominal cramping and off and on heavy vaginal bleeding.  She reports soaking 1 pad every 2-3 hours at times, and then lighter spotting only at other times.  She denies vaginal itching/burning, urinary symptoms, h/a, dizziness, n/v, or fever/chills.    Past Medical History  Diagnosis Date  . No pertinent past medical history   . Obese    Past Surgical History  Procedure Laterality Date  . Appendectomy    . Knee surgery    . Wisdom tooth extraction    . Dilation and curettage of uterus    . Dilation and evacuation N/A 08/22/2012    Procedure: DILATATION AND EVACUATION;  Surgeon: Mitchel Honour, DO;  Location: [facility];  Service: Gynecology;  Laterality: N/A;   History   Social History  . Marital Status: Married    Spouse Name: N/A    Number of Children: N/A  . Years of Education: N/A   Occupational History  . Not on file.   Social History Main Topics  . Smoking status: Never Smoker   . Smokeless tobacco: Not on file  . Alcohol Use: No  . Drug Use: No  . Sexually Active: Yes   Other Topics Concern  . Not on file   Social History Narrative  . No narrative on file   No current facility-administered medications on file prior to encounter.   Current Outpatient Prescriptions on File Prior to Encounter  Medication Sig Dispense Refill  . Prenatal Vit-Fe Fumarate-FA (PRENATAL MULTIVITAMIN) TABS Take 1 tablet by mouth daily.       Allergies  Allergen Reactions  . Percocet (Oxycodone-Acetaminophen) Itching    Pt states she thought she was going to die    ROS: Pertinent items in HPI  OBJECTIVE Blood pressure 103/65, pulse 95, temperature 97.4 F (36.3 C), temperature source Oral, resp. rate 18, last menstrual period 07/01/2012, unknown if currently  breastfeeding. GENERAL: Well-developed, well-nourished female in no acute distress.  HEENT: Normocephalic HEART: normal rate RESP: normal effort ABDOMEN: Soft, non-tender EXTREMITIES: Nontender, no edema NEURO: Alert and oriented Pelvic exam: Cervix pink, visually closed, without lesion, scant dark red bleeding from cervical os, vaginal walls and external genitalia normal Bimanual exam: Cervix 0/long/high, firm, anterior, neg CMT, uterus mildly tender, nonenlarged, adnexa without tenderness, enlargement, or mass    ASSESSMENT 1. S/P D&C (status post dilation and curettage)   2. Abdominal cramping   3. Vaginal bleeding     PLAN Called Dr Renaldo Fiddler to discuss assessment and findings Discharge home Methergine 0.2 mg Q 8 hours Lortab 5/325, 1-2 tabs Q 6 hours PRN Continue ibuprofen PO Call office to be seen tomorrow for f/u Return to MAU as needed    Medication List    STOP taking these medications       acetaminophen 325 MG tablet  Commonly known as:  TYLENOL      TAKE these medications       HYDROcodone-acetaminophen 5-325 MG per tablet  Commonly known as:  NORCO/VICODIN  Take 1-2 tablets by mouth every 6 (six) hours as needed for pain.     methylergonovine 0.2 MG tablet  Commonly known as:  METHERGINE  Take 1 tablet (0.2 mg total) by mouth every 8 (eight) hours.     prenatal multivitamin Tabs  Take 1 tablet  by mouth daily.           Follow-up Information   Schedule an appointment as soon as possible for a visit with ADKINS,GRETCHEN, MD. (Call office tomorrow morning to be seen.  Return to MAU as needed.)    Contact information:   [address] [place] [address] [place] [address] [phone]       Sharen Counter Certified Nurse-Midwife 08/30/2012  1:43 AM

## 2012-12-14 IMAGING — US US ABDOMEN COMPLETE
1 series · 14 of 25 positions shown · non-contrast
Comparison: 01/31/2012 abdominal series

CLINICAL DATA: Abdominal pain.

COMPLETE ABDOMINAL ULTRASOUND

[Series 1: us abdomen complete · 0.30mm/px · 14 of 48 slices shown]
[im 1/48]
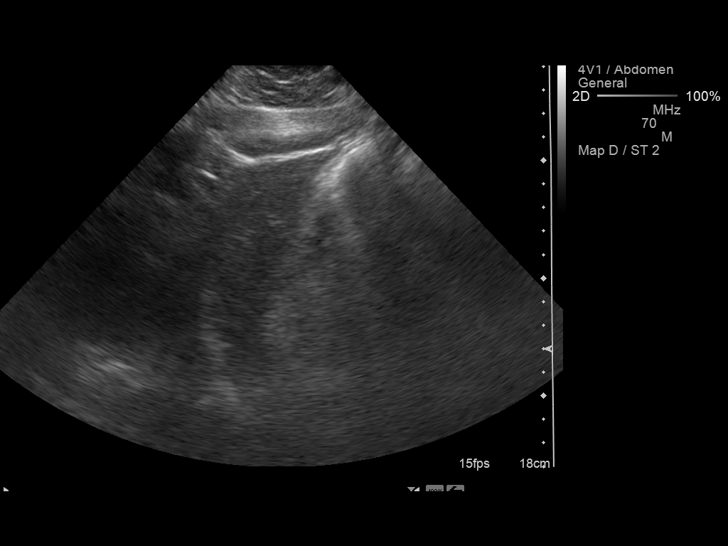
[im 4/48]
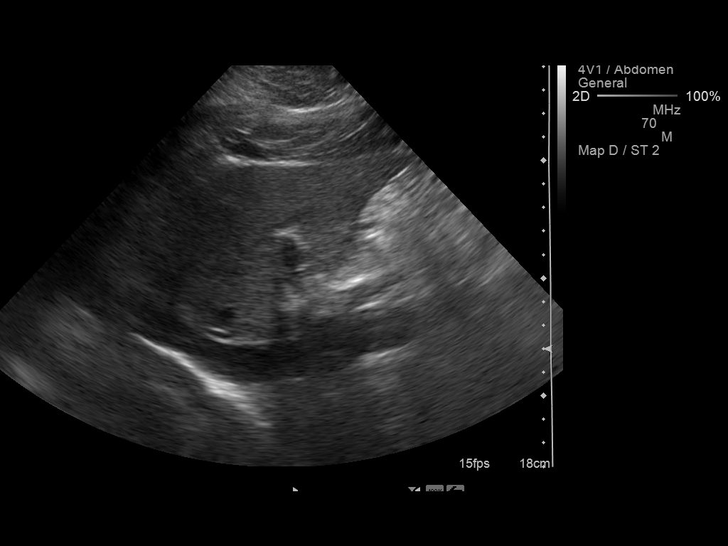
[im 8/48]
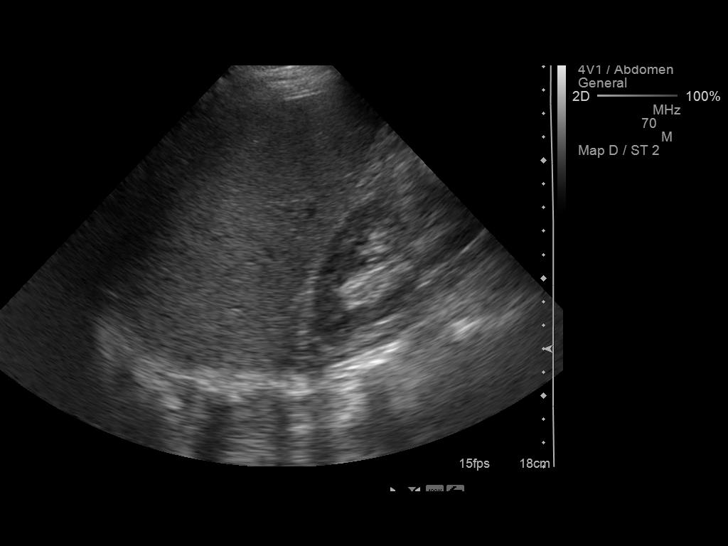
[im 12/48]
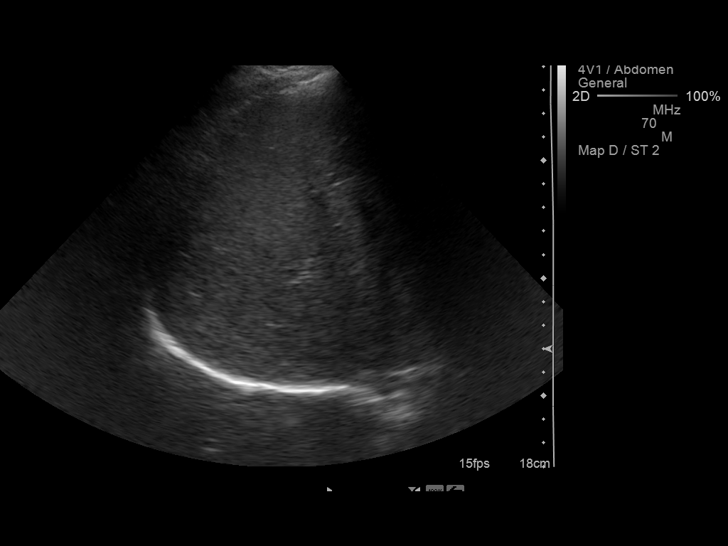
[im 16/48]
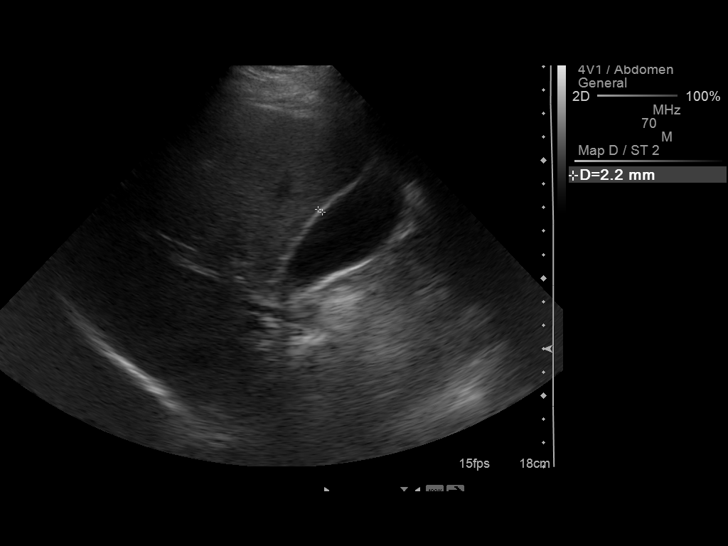
[im 18/48]
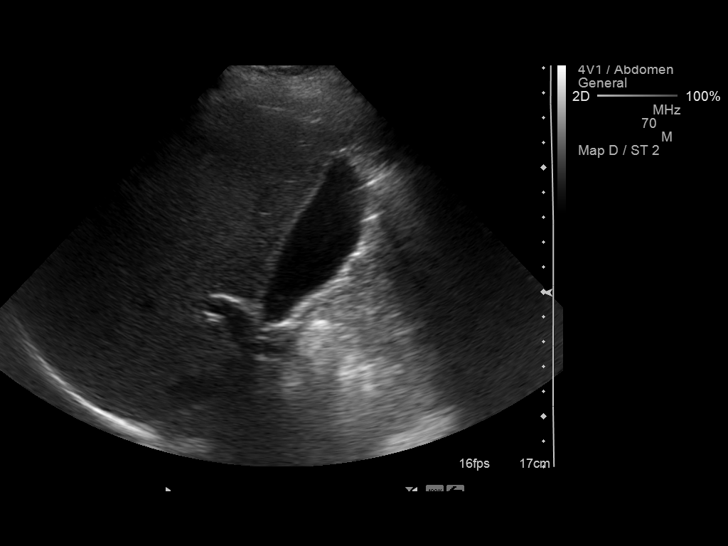
[im 22/48]
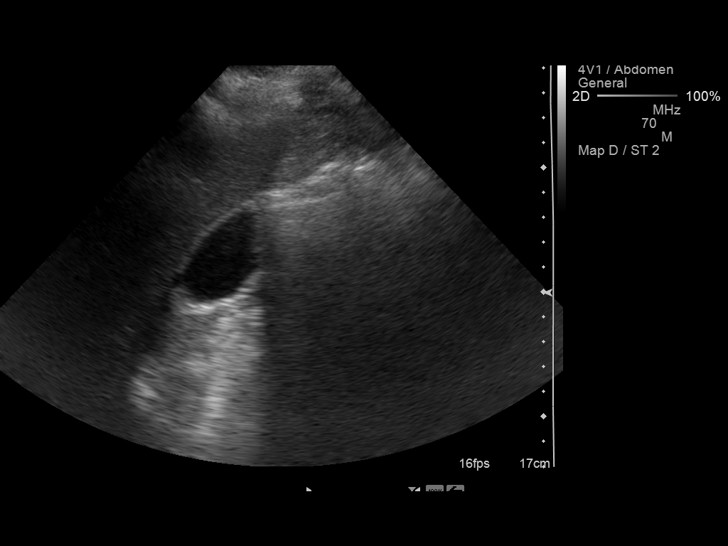
[im 26/48]
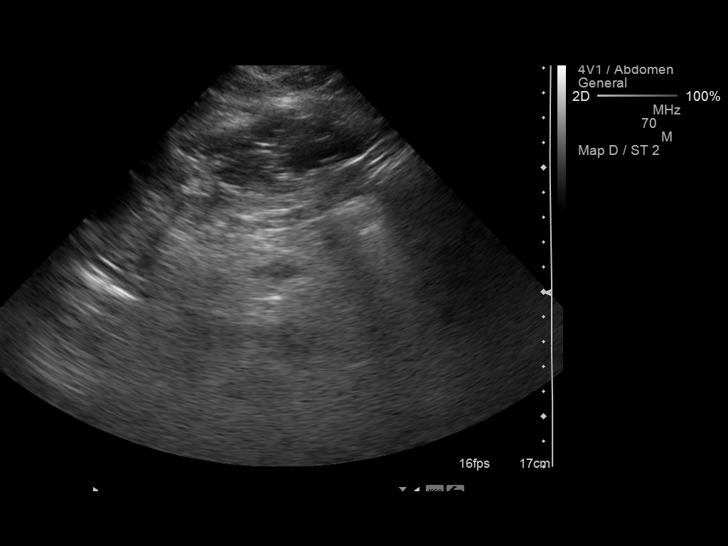
[im 30/48]
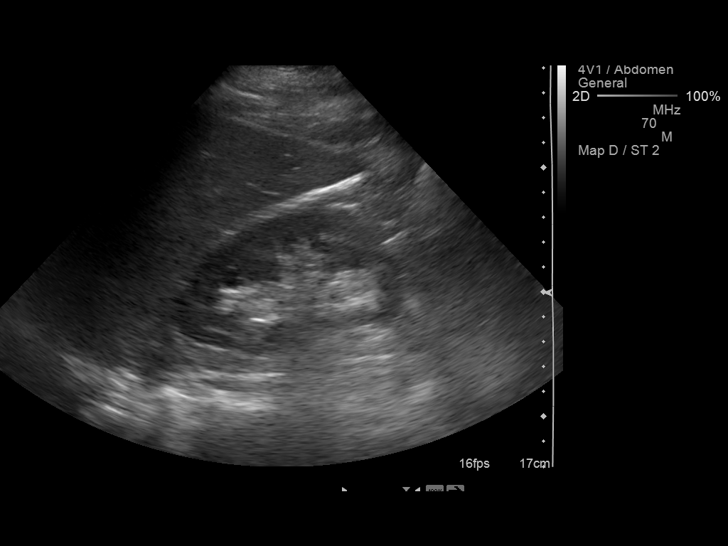
[im 32/48]
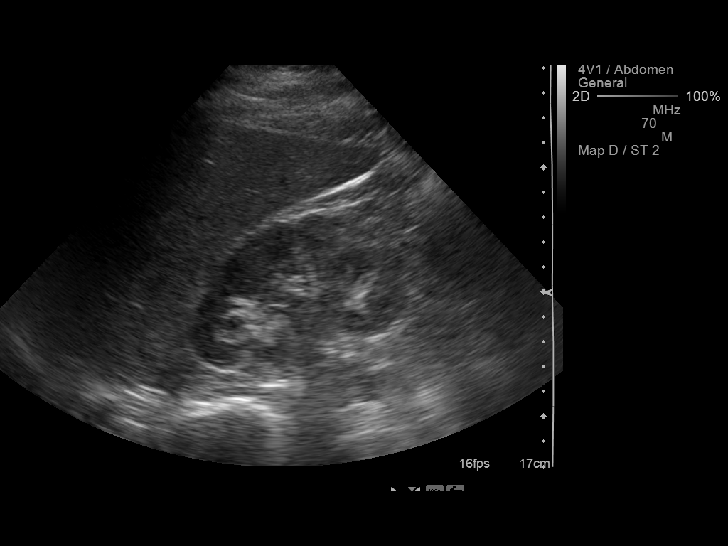
[im 36/48]
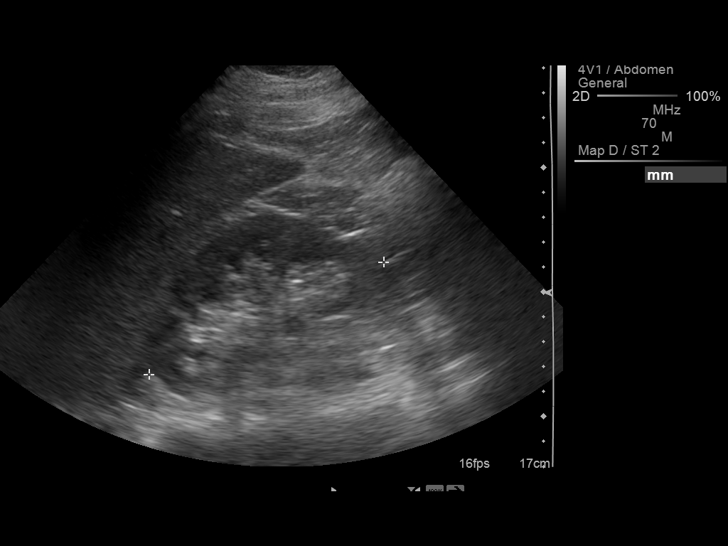
[im 40/48]
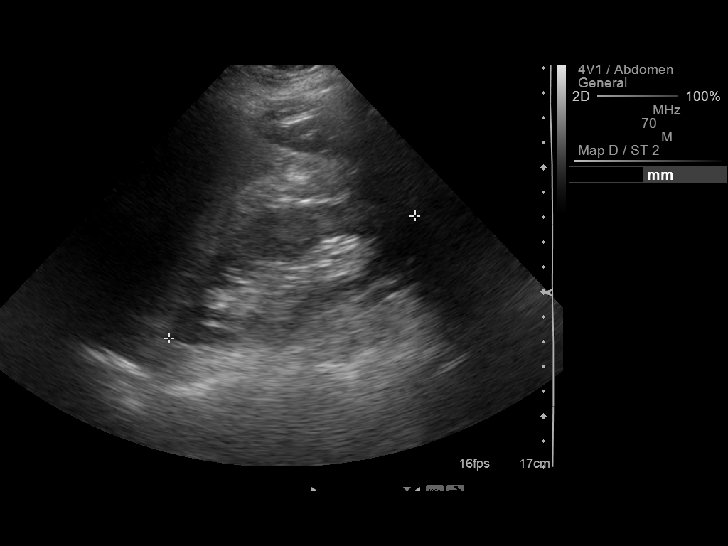
[im 44/48]
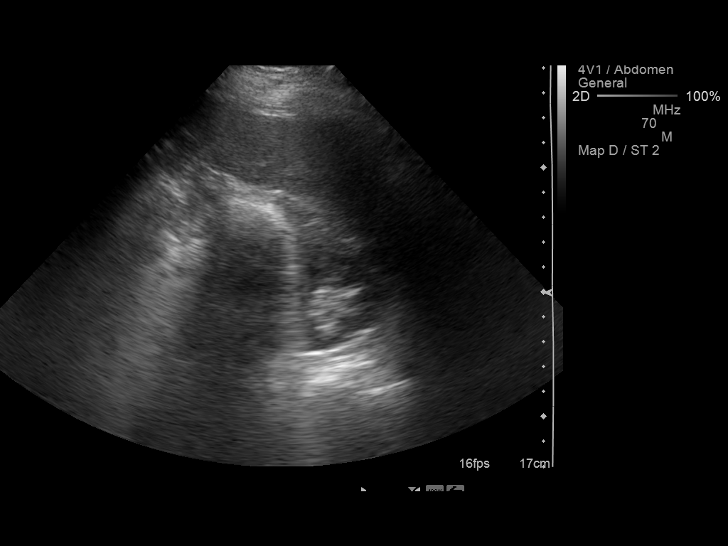
[im 48/48]
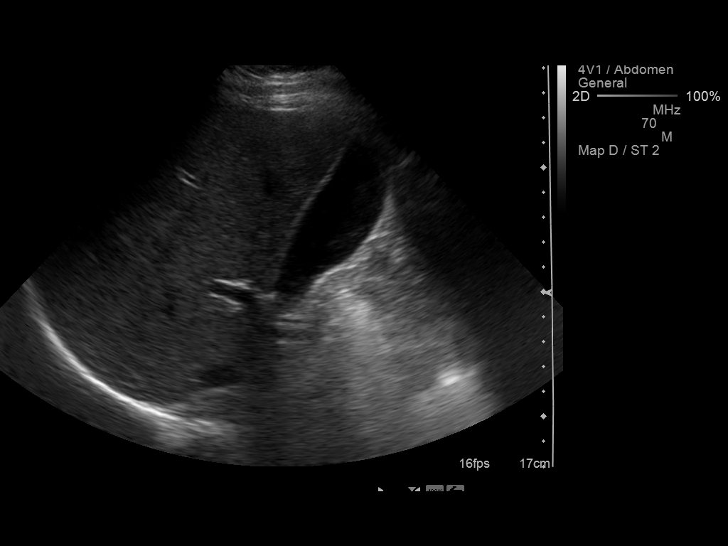

[14 of 25 positions shown; findings below may reference images not displayed]

FINDINGS: Gallbladder:  Gallbladder has a normal appearance.  Gallbladder
wall is 2.2 mm., within normal limits.  No stones or
pericholecystic fluid.  No sonographic Murphy's sign.

Common bile duct:  Within normal limits at 4.6 mm.

Liver:  No focal lesion identified.  Within normal limits in
parenchymal echogenicity.

IVC:  Appears normal.

Pancreas:  The pancreas is not well seen because of overlying bowel
gas.

Spleen:  Normal appearance, 11.8 cm.

Right Kidney:  Normal appearance, 10.4 cm.

Left Kidney:  Normal appearance, 11.0 cm.

Abdominal aorta:  Within normal limits at 1.7 cm maximum diameter.
IMPRESSION: 1.  The pancreas is not well seen because of bowel gas.
2.  No evidence for acute abnormality.

## 2013-04-06 ENCOUNTER — Inpatient Hospital Stay (HOSPITAL_COMMUNITY): Payer: 59

## 2013-04-06 ENCOUNTER — Inpatient Hospital Stay (HOSPITAL_COMMUNITY)
Admission: AD | Admit: 2013-04-06 | Discharge: 2013-04-06 | Disposition: A | Discharge status: Home / Self Care | Payer: 59 | Source: Ambulatory Visit | Attending: Obstetrics and Gynecology | Admitting: Obstetrics and Gynecology

## 2013-04-06 ENCOUNTER — Encounter (HOSPITAL_COMMUNITY): Payer: Self-pay | Admitting: *Deleted

## 2013-04-06 DIAGNOSIS — N83209 Unspecified ovarian cyst, unspecified side: Secondary | ICD-10-CM

## 2013-04-06 DIAGNOSIS — N926 Irregular menstruation, unspecified: Secondary | ICD-10-CM | POA: Insufficient documentation

## 2013-04-06 DIAGNOSIS — N949 Unspecified condition associated with female genital organs and menstrual cycle: Secondary | ICD-10-CM | POA: Insufficient documentation

## 2013-04-06 DIAGNOSIS — N83201 Unspecified ovarian cyst, right side: Secondary | ICD-10-CM

## 2013-04-06 HISTORY — DX: Fatty (change of) liver, not elsewhere classified: K76.0

## 2013-04-06 HISTORY — DX: Polycystic ovarian syndrome: E28.2

## 2013-04-06 LAB — URINE MICROSCOPIC-ADD ON

## 2013-04-06 LAB — URINALYSIS, ROUTINE W REFLEX MICROSCOPIC
Bilirubin Urine: NEGATIVE
Ketones, ur: NEGATIVE mg/dL
Leukocytes, UA: NEGATIVE
Nitrite: NEGATIVE
Protein, ur: NEGATIVE mg/dL
pH: 6 (ref 5.0–8.0)

## 2013-04-06 MED ORDER — HYDROCODONE-ACETAMINOPHEN 5-325 MG PO TABS
2.0000 | ORAL_TABLET | Freq: Once | ORAL | Status: AC
  Administered 2013-04-06: 2 via ORAL
  Filled 2013-04-06: qty 2

## 2013-04-06 MED ORDER — ONDANSETRON 4 MG PO TBDP
4.0000 mg | ORAL_TABLET | Freq: Once | ORAL | Status: AC
  Administered 2013-04-06: 4 mg via ORAL
  Filled 2013-04-06: qty 1

## 2013-04-06 MED ORDER — OXYCODONE-ACETAMINOPHEN 5-325 MG PO TABS: 2.0000 | ORAL_TABLET | Freq: Once | ORAL | Status: DC

## 2013-04-06 NOTE — MAU Note (Signed)
Pt reports she had went to OB/Gyn last week for pelvic pain. Told she had a bacterial infection and u/s showed she had fluid around her ovary. Treated her bacterial infection. Still having pain went to [facility] and told she had a yeast infection and a cyst on her ovary. Today c/o pain on her RLQ and vaginal area and feeling dizzy and light headed at times like she might pass out . Reports vaginal bleeding started today as well. LMP 03/20/13. Pt stated she has started on some diet medication this week.(phentermine)

## 2013-04-06 NOTE — MAU Provider Note (Signed)
History     CSN: [id number]  Arrival date and time: 04/06/13 1535   First Provider Initiated Contact with Patient 04/06/13 1611      Chief Complaint  Patient presents with  . Pelvic Pain   HPI  [name] is a 27 y.o. non pregnant  female who presents with right lower pelvic pain and vaginal pain. She was seen in her primary GYN's office last week and had an US done that showed polycystic ovaries, she also went to [facility] emergency room Friday night and US showed a cyst on her right ovary. She is concerned because her pain today is worse and she is having vaginal bleeding, however does have a history of irregular periods; she rights her pain a 9/10 currently. She had a pelvic exam done on Friday and was treated for yeast.  She took hydrocodone at 12:00 without relief, she has not tried NSAIDS.   OB History   Grav Para Term Preterm Abortions TAB SAB Ect Mult Living   3    3  3    0      Past Medical History  Diagnosis Date  . No pertinent past medical history   . Obese   . Fatty liver   . PCOS (polycystic ovarian syndrome)     Past Surgical History  Procedure Laterality Date  . Appendectomy    . Knee surgery    . Wisdom tooth extraction    . Dilation and curettage of uterus    . Dilation and evacuation N/A 08/22/2012    Procedure: DILATATION AND EVACUATION;  Surgeon: Mitchel Honour, DO;  Location: [facility];  Service: Gynecology;  Laterality: N/A;    Family History  Problem Relation Age of Onset  . Diabetes Mother   . Cancer Mother   . Hypothyroidism Mother   . Diabetes Father   . Cancer Father     History  Substance Use Topics  . Smoking status: Never Smoker   . Smokeless tobacco: Not on file  . Alcohol Use: No    Allergies:  Allergies  Allergen Reactions  . Percocet [Oxycodone-Acetaminophen] Itching    Pt states she thought she was going to die    Prescriptions prior to admission  Medication Sig Dispense Refill  . HYDROcodone-acetaminophen  (NORCO/VICODIN) 5-325 MG per tablet Take 1-2 tablets by mouth every 6 (six) hours as needed for pain.  30 tablet  0  . methylergonovine (METHERGINE) 0.2 MG tablet Take 1 tablet (0.2 mg total) by mouth every 8 (eight) hours.  9 tablet  0  . Prenatal Vit-Fe Fumarate-FA (PRENATAL MULTIVITAMIN) TABS Take 1 tablet by mouth daily.       Results for orders placed during the hospital encounter of 04/06/13 (from the past 24 hour(s))  URINALYSIS, ROUTINE W REFLEX MICROSCOPIC     Status: Abnormal   Collection Time    04/06/13  3:40 PM      Result Value Range   Color, Urine YELLOW  YELLOW   APPearance CLEAR  CLEAR   Specific Gravity, Urine >1.030 (*) 1.005 - 1.030   pH 6.0  5.0 - 8.0   Glucose, UA NEGATIVE  NEGATIVE mg/dL   Hgb urine dipstick LARGE (*) NEGATIVE   Bilirubin Urine NEGATIVE  NEGATIVE   Ketones, ur NEGATIVE  NEGATIVE mg/dL   Protein, ur NEGATIVE  NEGATIVE mg/dL   Urobilinogen, UA 0.2  0.0 - 1.0 mg/dL   Nitrite NEGATIVE  NEGATIVE   Leukocytes, UA NEGATIVE  NEGATIVE  URINE MICROSCOPIC-ADD  ON     Status: Abnormal   Collection Time    04/06/13  3:40 PM      Result Value Range   Squamous Epithelial / LPF FEW (*) RARE   WBC, UA 0-2  <3 WBC/hpf   RBC / HPF 7-10  <3 RBC/hpf   Urine-Other MUCOUS PRESENT    POCT PREGNANCY, URINE     Status: None   Collection Time    04/06/13  5:09 PM      Result Value Range   Preg Test, Ur NEGATIVE  NEGATIVE   US Transvaginal Non-ob  04/06/2013   CLINICAL DATA:  Right ovarian cyst, plain and dysfunctional uterine bleeding.  EXAM: TRANSABDOMINAL AND TRANSVAGINAL ULTRASOUND OF PELVIS  DOPPLER ULTRASOUND OF OVARIES  TECHNIQUE: Both transabdominal and transvaginal ultrasound examinations of the pelvis were performed. Transabdominal technique was performed for global imaging of the pelvis including uterus, ovaries, adnexal regions, and pelvic cul-de-sac.  It was necessary to proceed with endovaginal exam following the transabdominal exam to visualize the uterus  and ovaries. Color and duplex Doppler ultrasound was utilized to evaluate blood flow to the ovaries.  COMPARISON:  Pelvic ultrasound yesterday at [facility].  FINDINGS: Uterus  Measurements: 8.2 x 3.7 x 5.2 cm No fibroids or other mass visualized.  Endometrium  Thickness: 10 mm. A small amount of fluid is present in the lower endometrial canal.  Right ovary  Measurements: 4.6 x 4.0 x 3.1 cm. Simple cyst of the right ovary measures roughly 3.8 x 3.0 x 2.9 cm. This is most likely physiologic.  Left ovary  Measurements: 2.9 x 2.4 x 2.5 cm. Normal appearance/no adnexal mass.  Pulsed Doppler evaluation of both ovaries demonstrates normal low-resistance arterial and venous waveforms. There is no evidence of ovarian torsion.  Other findings  A small amount of free fluid is identified.  IMPRESSION: Simple cyst of the right ovary measuring 3.8 cm in greatest diameter. This is most likely physiologic.  No sonographic evidence for ovarian torsion.   Electronically Signed   By: Irish Lack M.D.   On: 04/06/2013 17:51   US Pelvis Complete  04/06/2013   CLINICAL DATA:  Right ovarian cyst, plain and dysfunctional uterine bleeding.  EXAM: TRANSABDOMINAL AND TRANSVAGINAL ULTRASOUND OF PELVIS  DOPPLER ULTRASOUND OF OVARIES  TECHNIQUE: Both transabdominal and transvaginal ultrasound examinations of the pelvis were performed. Transabdominal technique was performed for global imaging of the pelvis including uterus, ovaries, adnexal regions, and pelvic cul-de-sac.  It was necessary to proceed with endovaginal exam following the transabdominal exam to visualize the uterus and ovaries. Color and duplex Doppler ultrasound was utilized to evaluate blood flow to the ovaries.  COMPARISON:  Pelvic ultrasound yesterday at [facility].  FINDINGS: Uterus  Measurements: 8.2 x 3.7 x 5.2 cm No fibroids or other mass visualized.  Endometrium  Thickness: 10 mm. A small amount of fluid is present in the lower  endometrial canal.  Right ovary  Measurements: 4.6 x 4.0 x 3.1 cm. Simple cyst of the right ovary measures roughly 3.8 x 3.0 x 2.9 cm. This is most likely physiologic.  Left ovary  Measurements: 2.9 x 2.4 x 2.5 cm. Normal appearance/no adnexal mass.  Pulsed Doppler evaluation of both ovaries demonstrates normal low-resistance arterial and venous waveforms. There is no evidence of ovarian torsion.  Other findings  A small amount of free fluid is identified.  IMPRESSION: Simple cyst of the right ovary measuring 3.8 cm in greatest diameter. This is most likely  physiologic.  No sonographic evidence for ovarian torsion.   Electronically Signed   By: Irish Lack M.D.   On: 04/06/2013 17:51   [place] Art/ven Flow Abd Pelv Doppler  04/06/2013   CLINICAL DATA:  Right ovarian cyst, plain and dysfunctional uterine bleeding.  EXAM: TRANSABDOMINAL AND TRANSVAGINAL ULTRASOUND OF PELVIS  DOPPLER ULTRASOUND OF OVARIES  TECHNIQUE: Both transabdominal and transvaginal ultrasound examinations of the pelvis were performed. Transabdominal technique was performed for global imaging of the pelvis including uterus, ovaries, adnexal regions, and pelvic cul-de-sac.  It was necessary to proceed with endovaginal exam following the transabdominal exam to visualize the uterus and ovaries. Color and duplex Doppler ultrasound was utilized to evaluate blood flow to the ovaries.  COMPARISON:  Pelvic ultrasound yesterday at [facility].  FINDINGS: Uterus  Measurements: 8.2 x 3.7 x 5.2 cm No fibroids or other mass visualized.  Endometrium  Thickness: 10 mm. A small amount of fluid is present in the lower endometrial canal.  Right ovary  Measurements: 4.6 x 4.0 x 3.1 cm. Simple cyst of the right ovary measures roughly 3.8 x 3.0 x 2.9 cm. This is most likely physiologic.  Left ovary  Measurements: 2.9 x 2.4 x 2.5 cm. Normal appearance/no adnexal mass.  Pulsed Doppler evaluation of both ovaries demonstrates normal low-resistance  arterial and venous waveforms. There is no evidence of ovarian torsion.  Other findings  A small amount of free fluid is identified.  IMPRESSION: Simple cyst of the right ovary measuring 3.8 cm in greatest diameter. This is most likely physiologic.  No sonographic evidence for ovarian torsion.   Electronically Signed   By: Irish Lack M.D.   On: 04/06/2013 17:51    Review of Systems  Constitutional: Positive for diaphoresis. Negative for fever and chills.  Gastrointestinal: Positive for abdominal pain and constipation. Negative for nausea, vomiting and diarrhea.       No bowel movement since Friday   Genitourinary: Negative for dysuria, urgency, frequency and hematuria.       No vaginal discharge. + vaginal bleeding. No dysuria.   Neurological: Positive for dizziness and weakness. Negative for headaches.   Physical Exam   Blood pressure 114/70, pulse 81, temperature 98 F (36.7 C), resp. rate 18, height 5' 2.5" (1.588 m), weight 241 lb 12.8 oz (109.68 kg), last menstrual period 03/20/2013, unknown if currently breastfeeding.  Physical Exam  Constitutional: She is oriented to person, place, and time. She appears well-developed and well-nourished. No distress.  Eyes: Pupils are equal, round, and reactive to light.  Neck: Neck supple.  Respiratory: Effort normal.  GI: Soft. There is tenderness.  Genitourinary:  Bimanual exam: Cervix closed Cervical position: Anterior  Uterus non tender, normal size Right adnexal tenderness, no masses bilaterally Chaperone present for exam.   Musculoskeletal: Normal range of motion.  Neurological: She is alert and oriented to person, place, and time.  Skin: Skin is warm. She is diaphoretic.  Psychiatric: Her behavior is normal.   MAU Course  Procedures None   MDM Bimanual exam  Pelvic US complete I offered patient Toradol 60 mg and pt declined stating that this medication does not work for her  2 Norco tabs 1 zofran   Consulted with  Dr. Henderson Cloud; plan of care discussed and ok to discharge home to have her follow up in the office. Pt currently rates her pain a 6/10 and is sleeping in the bed  Assessment and Plan  A: Right ovarian cyst 3.8 cm- no sign of  ovarian torsion on [place]    P: Discharge home Continue Norco as prescribed Ok to take ibuprofen as directed on the bottle Return to MAU if symptoms worsen Follow up with Dr. Huel Coventry office: call to schedule an appointment     Venia Carbon IRENE FNP-C 04/06/2013, 6:36 PM

## 2014-02-18 IMAGING — US US TRANSVAGINAL NON-OB
1 series · 13 of 25 positions shown · non-contrast
Comparison: Pelvic ultrasound yesterday at [REDACTED].

CLINICAL DATA: Right ovarian cyst, plain and dysfunctional uterine
bleeding.

EXAM:
TRANSABDOMINAL AND TRANSVAGINAL ULTRASOUND OF PELVIS
DOPPLER ULTRASOUND OF OVARIES
TECHNIQUE: Both transabdominal and transvaginal ultrasound examinations of the
pelvis were performed. Transabdominal technique was performed for
global imaging of the pelvis including uterus, ovaries, adnexal
regions, and pelvic cul-de-sac.
It was necessary to proceed with endovaginal exam following the
transabdominal exam to visualize the uterus and ovaries. Color and
duplex Doppler ultrasound was utilized to evaluate blood flow to the
ovaries.

[Series 1: us pelvis complete · 45 acquisitions, 13 frames shown]
[im 1/45]
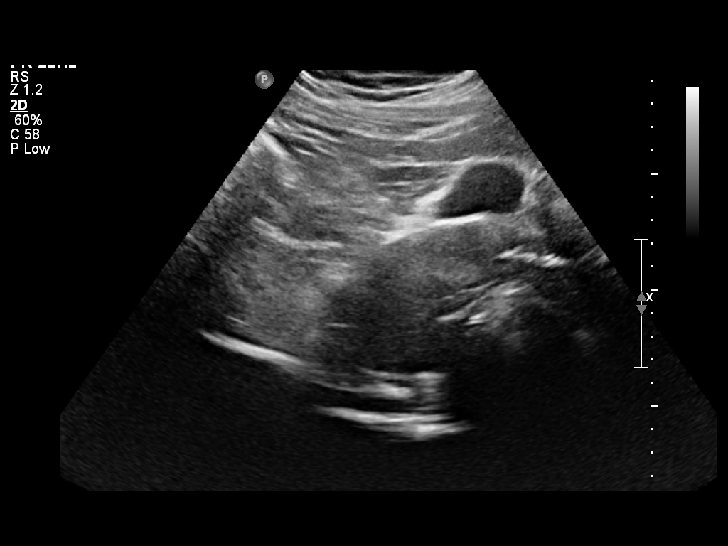
[im 4/45]
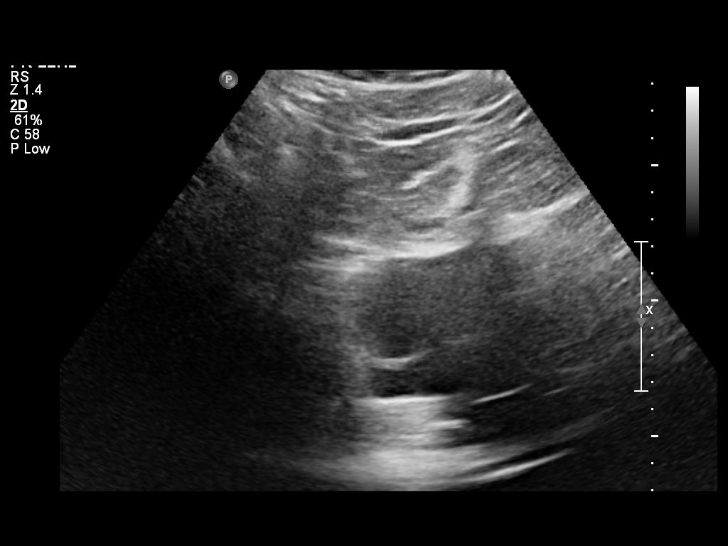
[im 8/45]
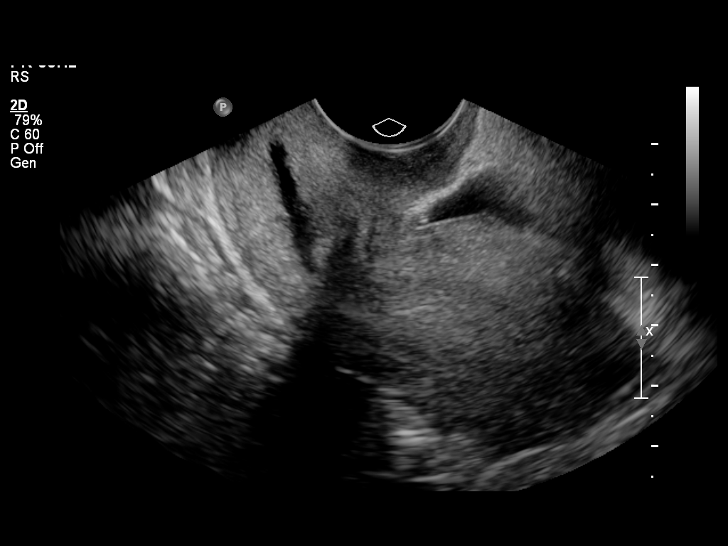
[im 12/45]
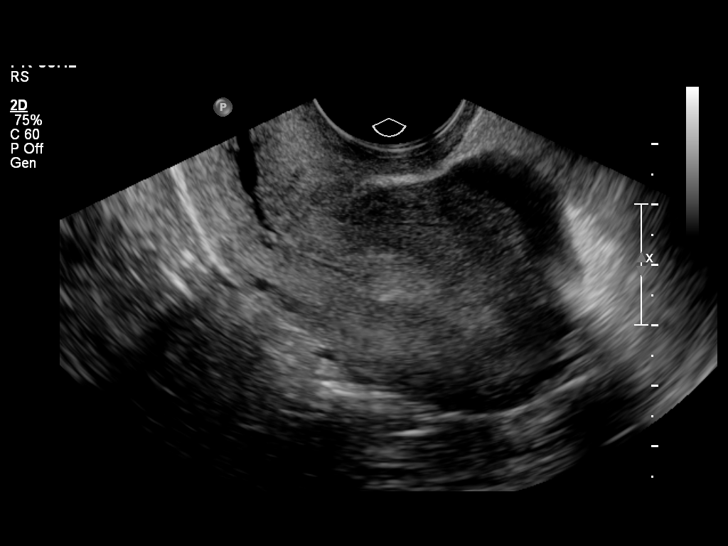
[im 15/45]
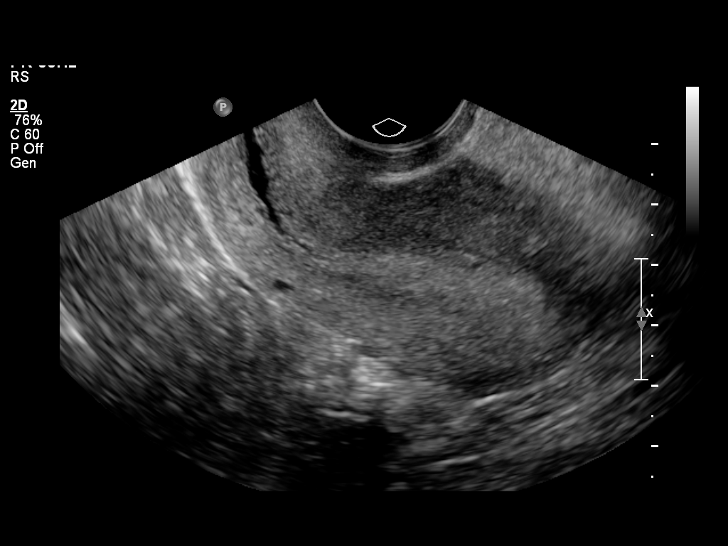
[im 19/45]
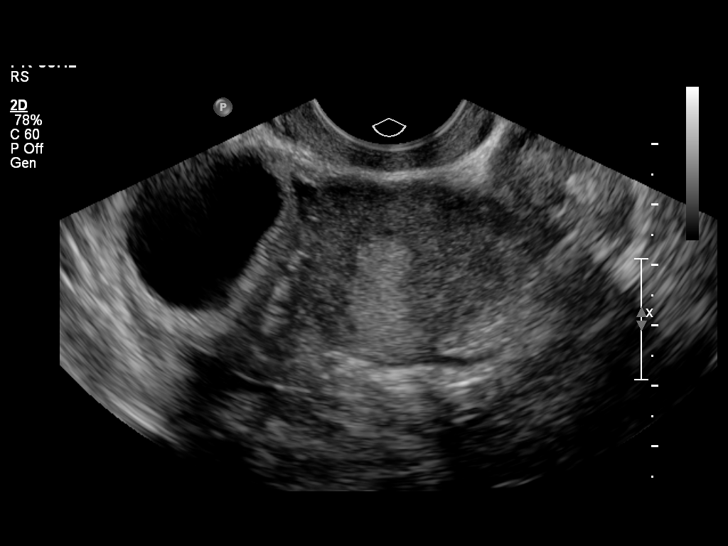
[im 23/45]
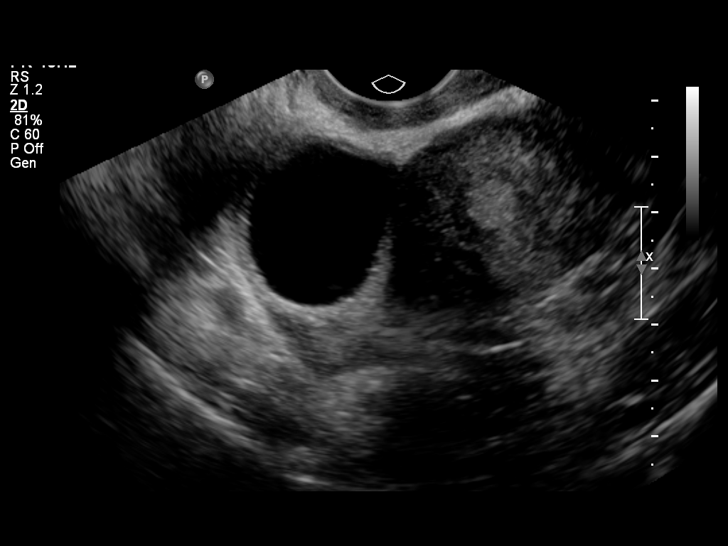
[im 26/45]
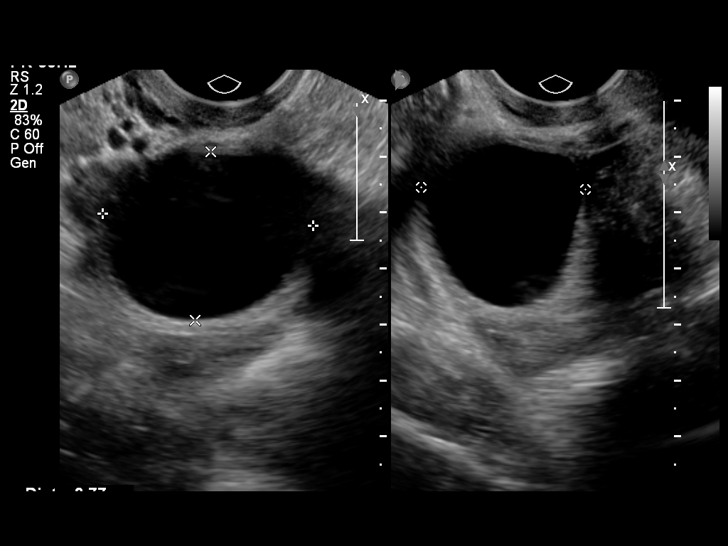
[im 30/45]
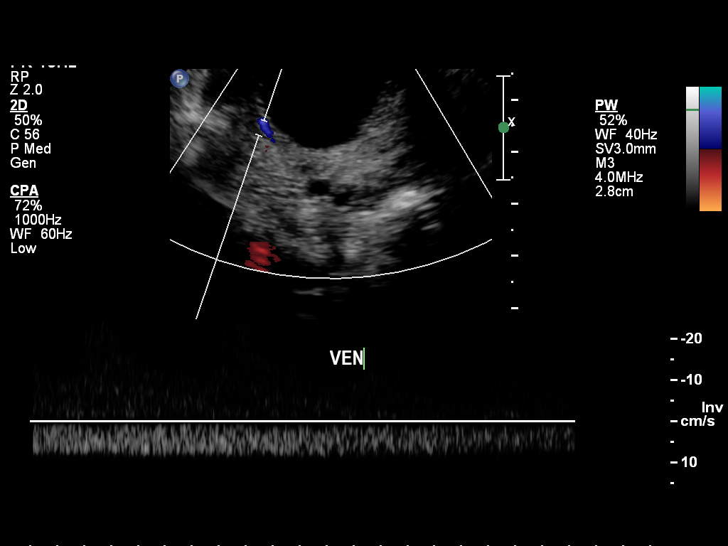
[im 34/45]
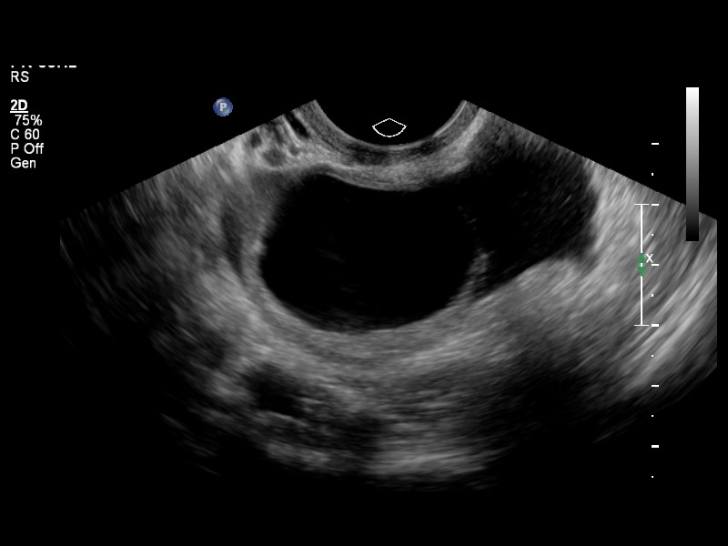
[im 37/45]
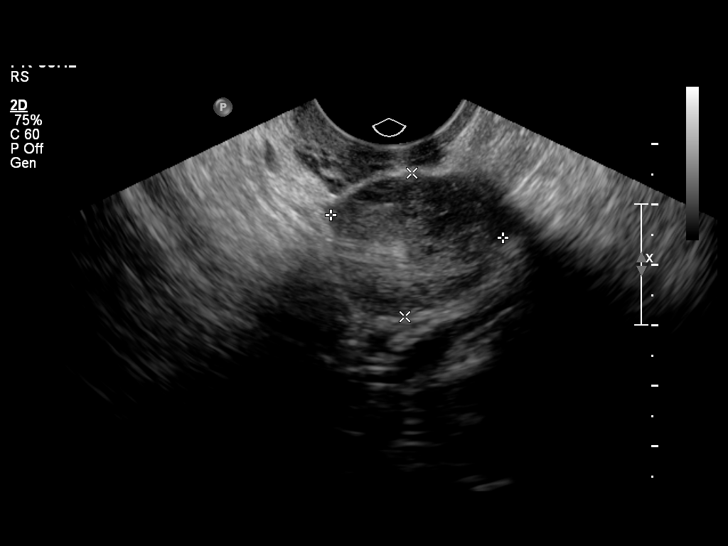
[im 41/45]
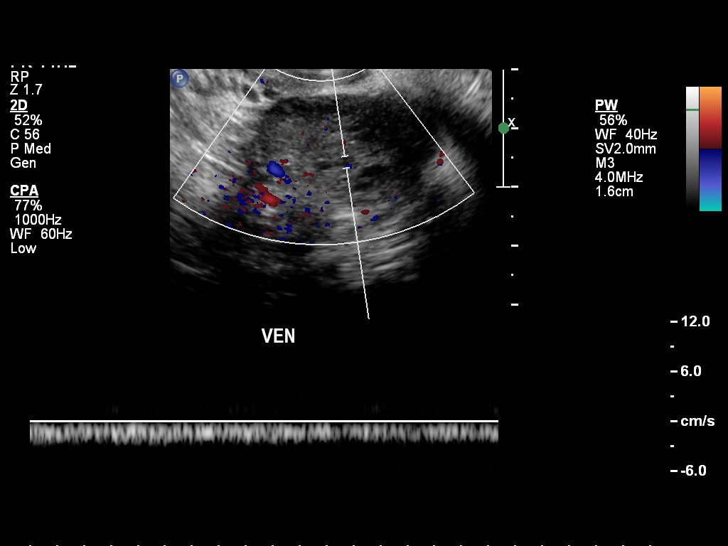
[im 45/45]
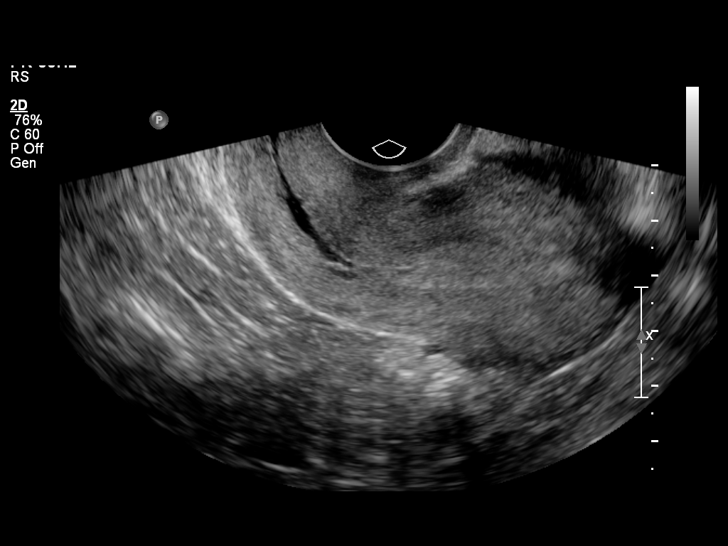

[13 of 25 positions shown; findings below may reference images not displayed]

FINDINGS: Uterus

Measurements: 8.2 x 3.7 x 5.2 cm No fibroids or other mass
visualized.

Endometrium

Thickness: 10 mm. A small amount of fluid is present in the lower
endometrial canal.

Right ovary

Measurements: 4.6 x 4.0 x 3.1 cm. Simple cyst of the right ovary
measures roughly 3.8 x 3.0 x 2.9 cm. This is most likely
physiologic.

Left ovary

Measurements: 2.9 x 2.4 x 2.5 cm. Normal appearance/no adnexal mass.

Pulsed Doppler evaluation of both ovaries demonstrates normal
low-resistance arterial and venous waveforms. There is no evidence
of ovarian torsion.

Other findings

A small amount of free fluid is identified.
IMPRESSION: Simple cyst of the right ovary measuring 3.8 cm in greatest
diameter. This is most likely physiologic.

No sonographic evidence for ovarian torsion.

## 2014-05-04 ENCOUNTER — Encounter (HOSPITAL_COMMUNITY): Payer: Self-pay | Admitting: *Deleted

## 2014-08-04 ENCOUNTER — Other Ambulatory Visit: Payer: Self-pay | Admitting: Obstetrics & Gynecology

## 2014-08-05 LAB — CYTOLOGY - PAP

## 2020-06-14 ENCOUNTER — Emergency Department (HOSPITAL_BASED_OUTPATIENT_CLINIC_OR_DEPARTMENT_OTHER)
Admission: EM | Admit: 2020-06-14 | Discharge: 2020-06-15 | Disposition: A | Discharge status: Other Acute Inpatient Hospital | Payer: Federal, State, Local not specified - Other | Attending: Emergency Medicine | Admitting: Emergency Medicine

## 2020-06-14 ENCOUNTER — Other Ambulatory Visit: Payer: Self-pay

## 2020-06-14 ENCOUNTER — Encounter (HOSPITAL_BASED_OUTPATIENT_CLINIC_OR_DEPARTMENT_OTHER): Payer: Self-pay | Admitting: *Deleted

## 2020-06-14 DIAGNOSIS — F322 Major depressive disorder, single episode, severe without psychotic features: Secondary | ICD-10-CM | POA: Insufficient documentation

## 2020-06-14 DIAGNOSIS — F4323 Adjustment disorder with mixed anxiety and depressed mood: Secondary | ICD-10-CM | POA: Insufficient documentation

## 2020-06-14 DIAGNOSIS — R45851 Suicidal ideations: Secondary | ICD-10-CM | POA: Insufficient documentation

## 2020-06-14 DIAGNOSIS — Z20822 Contact with and (suspected) exposure to covid-19: Secondary | ICD-10-CM | POA: Insufficient documentation

## 2020-06-14 LAB — ETHANOL: Alcohol, Ethyl (B): <10 mg/dL (ref <10)

## 2020-06-14 LAB — COMPREHENSIVE METABOLIC PANEL
ALT: 42 U/L (ref 0–44)
AST: 32 U/L (ref 15–41)
Albumin: 4.1 g/dL (ref 3.5–5.0)
Alkaline Phosphatase: 67 U/L (ref 38–126)
Anion gap: 9 (ref 5–15)
BUN: 8 mg/dL (ref 6–20)
CO2: 27 mmol/L (ref 22–32)
Calcium: 9 mg/dL (ref 8.9–10.3)
Chloride: 101 mmol/L (ref 98–111)
Creatinine, Ser: 0.91 mg/dL (ref 0.44–1.00)
GFR, Estimated: >60 mL/min (ref >60)
Glucose, Bld: 101 mg/dL — ABNORMAL HIGH (ref 70–99)
Potassium: 3.7 mmol/L (ref 3.5–5.1)
Sodium: 137 mmol/L (ref 135–145)
Total Bilirubin: 0.9 mg/dL (ref 0.3–1.2)
Total Protein: 7 g/dL (ref 6.5–8.1)

## 2020-06-14 LAB — ACETAMINOPHEN LEVEL: Acetaminophen (Tylenol), Serum: <10 ug/mL — ABNORMAL LOW (ref 10–30)

## 2020-06-14 LAB — RAPID URINE DRUG SCREEN, HOSP PERFORMED
Amphetamines: NOT DETECTED
Barbiturates: NOT DETECTED
Benzodiazepines: NOT DETECTED
Cocaine: NOT DETECTED
Opiates: NOT DETECTED
Tetrahydrocannabinol: NOT DETECTED

## 2020-06-14 LAB — SALICYLATE LEVEL: Salicylate Lvl: <7 mg/dL — ABNORMAL LOW (ref 7.0–30.0)

## 2020-06-14 LAB — CBC
HCT: 40.4 % (ref 36.0–46.0)
Hemoglobin: 13.2 g/dL (ref 12.0–15.0)
MCH: 27.4 pg (ref 26.0–34.0)
MCHC: 32.7 g/dL (ref 30.0–36.0)
MCV: 84 fL (ref 80.0–100.0)
Platelets: 223 10*3/uL (ref 150–400)
RBC: 4.81 MIL/uL (ref 3.87–5.11)
RDW: 14.6 % (ref 11.5–15.5)
WBC: 7.5 10*3/uL (ref 4.0–10.5)
nRBC: 0 % (ref 0.0–0.2)

## 2020-06-14 LAB — PREGNANCY, URINE: Preg Test, Ur: NEGATIVE

## 2020-06-14 NOTE — ED Provider Notes (Signed)
[facility] EMERGENCY DEPARTMENT Provider Note   CSN: [id number] Arrival date & time: 06/14/20  3474     History Chief Complaint  Patient presents with  . Suicidal    Rita Benton is a 34 y.o. female with past medical history significant for PCOS, fatty liver, obesity.  HPI Patient presents to emergency department today with chief complaining of suicidal thoughts x 2 days. Patient states she has been under increased stress lately. Something happened at home to make her have these suicidal thoughts.  She admits to having a plan.  Patient does not elaborate any further.  She denies any drug or alcohol use.  Denies any homicidal ideations.  Denies any auditory visual hallucinations.  Denies being in any physical pain.   Triage note states: Describe activating events associated with loss/negative event: Her husband is leaving her. Her adoptive son was taken away after 4 years.    Past Medical History:  Diagnosis Date  . Fatty liver   . No pertinent past medical history   . Obese   . PCOS (polycystic ovarian syndrome)     There are no problems to display for this patient.   Past Surgical History:  Procedure Laterality Date  . APPENDECTOMY    . DILATION AND CURETTAGE OF UTERUS    . DILATION AND EVACUATION N/A 08/22/2012   Procedure: DILATATION AND EVACUATION;  Surgeon: Mitchel Honour, DO;  Location: [facility];  Service: Gynecology;  Laterality: N/A;  . KNEE SURGERY    . WISDOM TOOTH EXTRACTION       OB History    Gravida  3   Para      Term      Preterm      AB  3   Living  0     SAB  3   IAB      Ectopic      Multiple      Live Births              Family History  Problem Relation Age of Onset  . Diabetes Mother   . Cancer Mother   . Hypothyroidism Mother   . Diabetes Father   . Cancer Father     Social History   Tobacco Use  . Smoking status: Never Smoker  . Smokeless tobacco: Never Used  Substance Use Topics  . Alcohol  use: No  . Drug use: No    Home Medications Prior to Admission medications   Medication Sig Start Date End Date Taking? Authorizing Provider  fluconazole (DIFLUCAN) 150 MG tablet Take 1 tablet by mouth once. 04/05/13   [provider]  HYDROcodone-acetaminophen (NORCO/VICODIN) 5-325 MG per tablet Take 1-2 tablets by mouth every 6 (six) hours as needed for pain. 08/29/12   Leftwich-Kirby, Wilmer Floor, CNM  metroNIDAZOLE (METROGEL) 0.75 % vaginal gel Place 1 application vaginally at bedtime. 03/31/13   [provider]  phentermine (ADIPEX-P) 37.5 MG tablet Take 37.5 mg by mouth daily before breakfast.    [provider]    Allergies    Percocet [oxycodone-acetaminophen]  Review of Systems   Review of Systems  Constitutional: Negative for chills and fever.  HENT: Negative for congestion, ear discharge, ear pain, sinus pressure, sinus pain and sore throat.   Eyes: Negative for pain and redness.  Respiratory: Negative for cough and shortness of breath.   Cardiovascular: Negative for chest pain.  Gastrointestinal: Negative for abdominal pain, constipation, diarrhea, nausea and vomiting.  Genitourinary: Negative for  dysuria and hematuria.  Musculoskeletal: Negative for back pain and neck pain.  Skin: Negative for wound.  Neurological: Negative for weakness, numbness and headaches.  Psychiatric/Behavioral: Positive for suicidal ideas.     Physical Exam Updated Vital Signs BP 105/66   Pulse 81   Temp 98.3 F (36.8 C) (Oral)   Resp 14   Ht 5' 2.5" (1.588 m)   Wt 122.5 kg   LMP 05/31/2020   SpO2 99%   BMI 48.60 kg/m   Physical Exam Vitals and nursing note reviewed.  Constitutional:      Appearance: She is well-developed. She is not ill-appearing or toxic-appearing.  HENT:     Head: Normocephalic and atraumatic.     Nose: Nose normal.  Eyes:     General: No scleral icterus.       Right eye: No discharge.        Left eye: No discharge.      Conjunctiva/sclera: Conjunctivae normal.  Neck:     Vascular: No JVD.  Cardiovascular:     Rate and Rhythm: Normal rate and regular rhythm.     Pulses: Normal pulses.     Heart sounds: Normal heart sounds.  Pulmonary:     Effort: Pulmonary effort is normal.     Breath sounds: Normal breath sounds.  Abdominal:     General: There is no distension.  Musculoskeletal:        General: Normal range of motion.     Cervical back: Normal range of motion.  Skin:    General: Skin is warm and dry.  Neurological:     Mental Status: She is oriented to person, place, and time.     GCS: GCS eye subscore is 4. GCS verbal subscore is 5. GCS motor subscore is 6.     Comments: Fluent speech, no facial droop.  Psychiatric:        Mood and Affect: Affect is flat.        Speech: Speech normal.        Behavior: Behavior normal. Behavior is not withdrawn. Behavior is cooperative.        Thought Content: Thought content includes suicidal ideation. Thought content does not include homicidal ideation. Thought content includes suicidal plan. Thought content does not include homicidal plan.     ED Results / Procedures / Treatments   Labs (all labs ordered are listed, but only abnormal results are displayed) Labs Reviewed  COMPREHENSIVE METABOLIC PANEL - Abnormal; Notable for the following components:      Result Value   Glucose, Bld 101 (*)    All other components within normal limits  SALICYLATE LEVEL - Abnormal; Notable for the following components:   Salicylate Lvl <7.0 (*)    All other components within normal limits  ACETAMINOPHEN LEVEL - Abnormal; Notable for the following components:   Acetaminophen (Tylenol), Serum <10 (*)    All other components within normal limits  RESP PANEL BY RT-PCR (FLU A&B, COVID) ARPGX2  ETHANOL  CBC  RAPID URINE DRUG SCREEN, HOSP PERFORMED  PREGNANCY, URINE    EKG None  Radiology No results found.  Procedures Procedures (including critical care  time)  Medications Ordered in ED Medications - No data to display  ED Course  I have reviewed the triage vital signs and the nursing notes.  Pertinent labs & imaging results that were available during my care of the patient were reviewed by me and considered in my medical decision making (see chart for details).  MDM Rules/Calculators/A&P                          History provided by patient with additional history obtained from chart review.    No medical complaints today.  Labs ordered and reviewed. They are unremarkable.  She is not forthcoming with information.  Has flat affect.  She did admit to being suicidal with a plan.  Does not appear to be responding to internal stimuli.  Pt is medically cleared for TTS evaluation, disposition per [facility]. She is here voluntarily and agreeable to stay for eval  Sign out to default provider. Still pending TTS eval at the end of my shift. Patient currently sleeping.   Portions of this note were generated with Scientist, clinical (histocompatibility and immunogenetics). Dictation errors may occur despite best attempts at proofreading.   Final Clinical Impression(s) / ED Diagnoses Final diagnoses:  Suicidal ideation    Rx / DC Orders ED Discharge Orders    None       Kandice Hams 06/15/20 0030    Pollyann Savoy, MD 06/15/20 1451

## 2020-06-14 NOTE — ED Triage Notes (Signed)
States she has been having suicidal thoughts for a couple of days. No hx of same. No plan. No attempt to hurt herself.

## 2020-06-14 NOTE — ED Notes (Signed)
Pt was wanded by security while she was at triage.

## 2020-06-14 NOTE — ED Notes (Addendum)
Per RN Misty pt has already been wanded by security. Pt given wine colored scrubs and socks. Pt changed into scrubs and belongings placed belongs bag and placed over RT. Pt still has her phone.

## 2020-06-14 NOTE — ED Notes (Signed)
Removed pt cellular device and placed it with her other belongings/

## 2020-06-15 ENCOUNTER — Inpatient Hospital Stay (HOSPITAL_COMMUNITY)
Admission: AD | Admit: 2020-06-15 | Discharge: 2020-06-18 | DRG: 882 | Disposition: A | Discharge status: Home / Self Care | Payer: Federal, State, Local not specified - Other | Source: Intra-hospital | Attending: Psychiatry | Admitting: Psychiatry

## 2020-06-15 ENCOUNTER — Encounter (HOSPITAL_COMMUNITY): Payer: Self-pay | Admitting: Psychiatry

## 2020-06-15 DIAGNOSIS — Z79899 Other long term (current) drug therapy: Secondary | ICD-10-CM

## 2020-06-15 DIAGNOSIS — F322 Major depressive disorder, single episode, severe without psychotic features: Secondary | ICD-10-CM

## 2020-06-15 DIAGNOSIS — E669 Obesity, unspecified: Secondary | ICD-10-CM | POA: Diagnosis present

## 2020-06-15 DIAGNOSIS — K760 Fatty (change of) liver, not elsewhere classified: Secondary | ICD-10-CM | POA: Diagnosis present

## 2020-06-15 DIAGNOSIS — F329 Major depressive disorder, single episode, unspecified: Secondary | ICD-10-CM | POA: Diagnosis present

## 2020-06-15 DIAGNOSIS — Z791 Long term (current) use of non-steroidal anti-inflammatories (NSAID): Secondary | ICD-10-CM | POA: Diagnosis not present

## 2020-06-15 DIAGNOSIS — E282 Polycystic ovarian syndrome: Secondary | ICD-10-CM | POA: Diagnosis present

## 2020-06-15 DIAGNOSIS — F4323 Adjustment disorder with mixed anxiety and depressed mood: Secondary | ICD-10-CM | POA: Diagnosis present

## 2020-06-15 DIAGNOSIS — Z809 Family history of malignant neoplasm, unspecified: Secondary | ICD-10-CM | POA: Diagnosis not present

## 2020-06-15 DIAGNOSIS — Z833 Family history of diabetes mellitus: Secondary | ICD-10-CM | POA: Diagnosis not present

## 2020-06-15 LAB — HEMOGLOBIN A1C
Hgb A1c MFr Bld: 5.3 % (ref 4.8–5.6)
Mean Plasma Glucose: 105.41 mg/dL

## 2020-06-15 LAB — RESP PANEL BY RT-PCR (FLU A&B, COVID) ARPGX2
Influenza A by PCR: NEGATIVE
Influenza B by PCR: NEGATIVE
SARS Coronavirus 2 by RT PCR: NEGATIVE

## 2020-06-15 LAB — TSH: TSH: 4.652 u[IU]/mL — ABNORMAL HIGH (ref 0.350–4.500)

## 2020-06-15 MED ORDER — HYDROXYZINE HCL 25 MG PO TABS
25.0000 mg | ORAL_TABLET | Freq: Three times a day (TID) | ORAL | Status: DC | PRN
  Administered 2020-06-16 – 2020-06-17 (×2): 25 mg via ORAL
  Filled 2020-06-15: qty 10
  Filled 2020-06-15 (×3): qty 1

## 2020-06-15 MED ORDER — ACETAMINOPHEN 325 MG PO TABS: 650.0000 mg | ORAL_TABLET | Freq: Four times a day (QID) | ORAL | Status: DC | PRN

## 2020-06-15 MED ORDER — ONDANSETRON 4 MG PO TBDP
4.0000 mg | ORAL_TABLET | Freq: Once | ORAL | Status: AC
  Administered 2020-06-15: 4 mg via ORAL
  Filled 2020-06-15: qty 1

## 2020-06-15 MED ORDER — TRAZODONE HCL 50 MG PO TABS
50.0000 mg | ORAL_TABLET | Freq: Every evening | ORAL | Status: DC | PRN
Start: 2020-06-15 — End: 2020-06-18
  Administered 2020-06-16 – 2020-06-17 (×2): 50 mg via ORAL
  Filled 2020-06-15 (×4): qty 1

## 2020-06-15 MED ORDER — SERTRALINE HCL 25 MG PO TABS
50.0000 mg | ORAL_TABLET | Freq: Every day | ORAL | Status: DC
  Administered 2020-06-15: 50 mg via ORAL
  Filled 2020-06-15: qty 2

## 2020-06-15 MED ORDER — TRAZODONE HCL 50 MG PO TABS: 50.0000 mg | ORAL_TABLET | Freq: Every evening | ORAL | Status: DC | PRN

## 2020-06-15 MED ORDER — ALUM & MAG HYDROXIDE-SIMETH 200-200-20 MG/5ML PO SUSP: 30.0000 mL | ORAL | Status: DC | PRN

## 2020-06-15 MED ORDER — MAGNESIUM HYDROXIDE 400 MG/5ML PO SUSP: 30.0000 mL | Freq: Every day | ORAL | Status: DC | PRN

## 2020-06-15 MED ORDER — IBUPROFEN 400 MG PO TABS
400.0000 mg | ORAL_TABLET | Freq: Four times a day (QID) | ORAL | Status: DC | PRN
  Administered 2020-06-16: 400 mg via ORAL
  Filled 2020-06-15: qty 1

## 2020-06-15 MED ORDER — FAMOTIDINE 40 MG PO TABS
40.0000 mg | ORAL_TABLET | Freq: Every day | ORAL | Status: DC
  Administered 2020-06-15 – 2020-06-18 (×4): 40 mg via ORAL
  Filled 2020-06-15 (×3): qty 1
  Filled 2020-06-15: qty 2
  Filled 2020-06-15 (×2): qty 1

## 2020-06-15 NOTE — ED Notes (Signed)
Per Inetta Fermo, NP, plan is for patient to be admitted to [facility]

## 2020-06-15 NOTE — ED Notes (Signed)
Pt reports that she has been having thoughts of "not being around anymore" Pt denies any plan or to hurt herself or anyone else. Pt has contracted for safety at this time. Pt reports having thoughts arise from husband (of 9 1/2 years) infidelity, 5 miscarriages over 8 years, Pt stated that she had a failed adoption of her foster child earlier this year. Pt is here to receive help with her thoughts.

## 2020-06-15 NOTE — Tx Team (Signed)
Initial Treatment Plan 06/15/2020 3:59 PM Rita Benton [id number]    PATIENT STRESSORS: Financial difficulties Marital or family conflict Traumatic event   PATIENT STRENGTHS: Ability for insight Capable of independent living Communication skills Physical Health Supportive family/friends Work skills   PATIENT IDENTIFIED PROBLEMS: anxiety  depression  Suicidal ideations  Family strain  Surveyor, quantity strain             DISCHARGE CRITERIA:  Ability to meet basic life and health needs Improved stabilization in mood, thinking, and/or behavior Motivation to continue treatment in a less acute level of care Need for constant or close observation no longer present  PRELIMINARY DISCHARGE PLAN: Attend aftercare/continuing care group Outpatient therapy Participate in family therapy Return to previous living arrangement Return to previous work or school arrangements  PATIENT/FAMILY INVOLVEMENT: This treatment plan has been presented to and reviewed with the patient, Rita Benton.  The patient and family have been given the opportunity to ask questions and make suggestions.  Raylene Miyamoto, RN 06/15/2020, 3:59 PM

## 2020-06-15 NOTE — ED Notes (Signed)
Updated patients mother about patients status. Requests to have patient call, phone provided.

## 2020-06-15 NOTE — ED Notes (Signed)
TTS in progress 

## 2020-06-15 NOTE — Progress Notes (Signed)
Pt accepted to [facility], bed 305-1   Berneice Heinrich, NP is the accepting provider.    Dr. Jola Babinski is the attending provider.    Call report to [phone]    [facility] ED notified.     Pt is involuntary and will be transported by law enforcement  Pt is scheduled to arrive at [facility] at 1pm.    Wells Guiles, MSW, LCSW, LCAS Clinical Social Worker II Disposition CSW [phone]

## 2020-06-15 NOTE — Progress Notes (Signed)
D: Patient presents with anxious affect but is pleasant upon assessment and interaction. Patient denies SI/HI at this time. Patient also denies AH/VH at this time. Patient contracts for safety.  A: Provided positive reinforcement and encouragement.  R: Patient cooperative and receptive to efforts. Patient remains safe on the unit.   06/15/20 2148  Psych Admission Type (Psych Patients Only)  Admission Status Voluntary  Psychosocial Assessment  Patient Complaints Anxiety;Depression  Eye Contact Fair  Facial Expression Anxious  Affect Anxious;Depressed  Speech Logical/coherent  Interaction Assertive  Motor Activity Fidgety  Appearance/Hygiene In scrubs  Behavior Characteristics Cooperative;Appropriate to situation  Mood Anxious;Depressed  Thought Process  Coherency WDL  Content UTA  Delusions WDL  Perception WDL  Hallucination None reported or observed  Judgment UTA  Confusion None  Danger to Self  Current suicidal ideation? Denies  Danger to Others  Danger to Others None reported or observed
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
## 2020-06-15 NOTE — Consult Note (Signed)
Telepsych Consultation   Reason for Consult: Suicidal ideations Referring Physician: [facility] emergency department physician Location of Patient:  Location of Provider: Other: [facility]  Patient Identification: Rita Benton MRN:  [id number] Principal Diagnosis: MDD (major depressive disorder), single episode, severe (HCC) Diagnosis:  Principal Problem:   MDD (major depressive disorder), single episode, severe (HCC)   Total Time spent with patient: 30 minutes  Subjective:   Rita Benton is a 34 y.o. female patient admitted with suicidal ideation. Patient states "I came because I was feeling suicidal." Patient endorses passive suicidal ideations. Patient denies plan at this time. Patient is not able to verbally contract for safety at this time. Patient denies any history of suicide attempts, denies any history of self-harm behaviors.  HPI: Patient reports recent stressors include a failed adoption with a child she cared for her for approximately 4-1/2 years, reports she brought him home from the hospital as a newborn. Patient reports the child was removed from her care approximately 2 weeks ago. Patient reports she plans on adoption however this did not take place. Patient reports she is recently separated from her husband who had an extramarital affair. Patient reports recently she has spoke with her foster son's therapist who brought up history of trauma including several miscarriages.  Patient resides in [place]. Patient denies access to weapons. Patient is employed at a [employer]." Patient denies alcohol and substance use. Patient reports protective factors including her mother who resides across the street from her home as well as church attendance and close relationship with church family.  Patient endorses decreased appetite and difficulty sleeping times approximately 2 weeks.  Patient reports she has never been seen by outpatient psychiatry  and currently has no outpatient psychiatric follow-up. Patient is interested in establishing outpatient psychiatry as well as counseling. Patient denies any family history of psychiatric illness to her knowledge.  Patient assessed by nurse practitioner. Patient alert and oriented, answers appropriately. Patient tearful during assessment. Patient denies homicidal ideations. Patient denies both auditory and visual hallucinations. There is no evidence of delusional thought content and no indication that patient is patient denies symptoms of paranoia.  Patient offered support and encouragement. Discussed treatment plan including inpatient admission, patient agrees with plan. Discussed initiating medications including Zoloft and trazodone as needed. Discussed risk versus benefits as well as side effects, patient agrees with plan.   Past Psychiatric History: None reported  Risk to Self: Currently passively suicidal, unable to contract for safety Risk to Others:  Denies Prior Inpatient Therapy:  Denies Prior Outpatient Therapy:  Denies  Past Medical History:  Past Medical History:  Diagnosis Date  . Fatty liver   . No pertinent past medical history   . Obese   . PCOS (polycystic ovarian syndrome)     Past Surgical History:  Procedure Laterality Date  . APPENDECTOMY    . DILATION AND CURETTAGE OF UTERUS    . DILATION AND EVACUATION N/A 08/22/2012   Procedure: DILATATION AND EVACUATION;  Surgeon: Mitchel Honour, DO;  Location: [facility];  Service: Gynecology;  Laterality: N/A;  . KNEE SURGERY    . WISDOM TOOTH EXTRACTION     Family History:  Family History  Problem Relation Age of Onset  . Diabetes Mother   . Cancer Mother   . Hypothyroidism Mother   . Diabetes Father   . Cancer Father    Family Psychiatric  History: None reported Social History:  Social History   Substance and Sexual Activity  Alcohol Use No     Social History   Substance and Sexual Activity  Drug Use No     Social History   Socioeconomic History  . Marital status: Married    Spouse name: Not on file  . Number of children: Not on file  . Years of education: Not on file  . Highest education level: Not on file  Occupational History  . Not on file  Tobacco Use  . Smoking status: Never Smoker  . Smokeless tobacco: Never Used  Substance and Sexual Activity  . Alcohol use: No  . Drug use: No  . Sexual activity: Yes    Birth control/protection: None  Other Topics Concern  . Not on file  Social History Narrative  . Not on file   Social Determinants of Health   Financial Resource Strain: Not on file  Food Insecurity: Not on file  Transportation Needs: Not on file  Physical Activity: Not on file  Stress: Not on file  Social Connections: Not on file   Additional Social History:    Allergies:   Allergies  Allergen Reactions  . Percocet [Oxycodone-Acetaminophen] Itching    Pt states she thought she was going to die    Labs:  Results for orders placed or performed during the hospital encounter of 06/14/20 (from the past 48 hour(s))  Rapid urine drug screen (hospital performed)     Status: None   Collection Time: 06/14/20  7:24 PM  Result Value Ref Range   Opiates NONE DETECTED NONE DETECTED   Cocaine NONE DETECTED NONE DETECTED   Benzodiazepines NONE DETECTED NONE DETECTED   Amphetamines NONE DETECTED NONE DETECTED   Tetrahydrocannabinol NONE DETECTED NONE DETECTED   Barbiturates NONE DETECTED NONE DETECTED    Comment: (NOTE) DRUG SCREEN FOR MEDICAL PURPOSES ONLY.  IF CONFIRMATION IS NEEDED FOR ANY PURPOSE, NOTIFY LAB WITHIN 5 DAYS.  LOWEST DETECTABLE LIMITS FOR URINE DRUG SCREEN Drug Class                     Cutoff (ng/mL) Amphetamine and metabolites    1000 Barbiturate and metabolites    200 Benzodiazepine                 200 Tricyclics and metabolites     300 Opiates and metabolites        300 Cocaine and metabolites        300 THC                             50 Performed at [facility], [address] [place], [place] [address]   Pregnancy, urine     Status: None   Collection Time: 06/14/20  7:24 PM  Result Value Ref Range   Preg Test, Ur NEGATIVE NEGATIVE    Comment:        THE SENSITIVITY OF THIS METHODOLOGY IS >20 mIU/mL. Performed at [facility], [address] [place], [place] [address]   Comprehensive metabolic panel     Status: Abnormal   Collection Time: 06/14/20  8:23 PM  Result Value Ref Range   Sodium 137 135 - 145 mmol/L   Potassium 3.7 3.5 - 5.1 mmol/L   Chloride 101 98 - 111 mmol/L   CO2 27 22 - 32 mmol/L   Glucose, Bld 101 (H) 70 - 99 mg/dL    Comment: Glucose reference range applies only to samples  taken after fasting for at least 8 hours.   BUN 8 6 - 20 mg/dL   Creatinine, Ser 1.28 0.44 - 1.00 mg/dL   Calcium 9.0 8.9 - 78.6 mg/dL   Total Protein 7.0 6.5 - 8.1 g/dL   Albumin 4.1 3.5 - 5.0 g/dL   AST 32 15 - 41 U/L   ALT 42 0 - 44 U/L   Alkaline Phosphatase 67 38 - 126 U/L   Total Bilirubin 0.9 0.3 - 1.2 mg/dL   GFR, Estimated >76 >72 mL/min    Comment: (NOTE) Calculated using the CKD-EPI Creatinine Equation (2021)    Anion gap 9 5 - 15    Comment: Performed at [facility], [address] [place], [place] [address]  Ethanol     Status: None   Collection Time: 06/14/20  8:23 PM  Result Value Ref Range   Alcohol, Ethyl (B) <10 <10 mg/dL    Comment:        LOWEST DETECTABLE LIMIT FOR SERUM ALCOHOL IS 10 mg/dL FOR MEDICAL PURPOSES ONLY Performed at [facility], [address] [place], [place] [address]   Salicylate level     Status: Abnormal   Collection Time: 06/14/20  8:23 PM  Result Value Ref Range   Salicylate Lvl <7.0 (L) 7.0 - 30.0 mg/dL    Comment: Performed at [facility], [address] [place], [place] [address]  Acetaminophen level     Status: Abnormal   Collection Time: 06/14/20  8:23 PM  Result Value Ref Range   Acetaminophen  (Tylenol), Serum <10 (L) 10 - 30 ug/mL    Comment: (NOTE) Therapeutic concentrations vary significantly. A range of 10-30 ug/mL  may be an effective concentration for many patients. However, some  are best treated at concentrations outside of this range. Acetaminophen concentrations >150 ug/mL at 4 hours after ingestion  and >50 ug/mL at 12 hours after ingestion are often associated with  toxic reactions.  Performed at [facility], [address] [place], [place] [address]   cbc     Status: None   Collection Time: 06/14/20  8:23 PM  Result Value Ref Range   WBC 7.5 4.0 - 10.5 K/uL   RBC 4.81 3.87 - 5.11 MIL/uL   Hemoglobin 13.2 12.0 - 15.0 g/dL   HCT 50.3 54.6 - 56.8 %   MCV 84.0 80.0 - 100.0 fL   MCH 27.4 26.0 - 34.0 pg   MCHC 32.7 30.0 - 36.0 g/dL   RDW 12.7 51.7 - 00.1 %   Platelets 223 150 - 400 K/uL   nRBC 0.0 0.0 - 0.2 %    Comment: Performed at [facility], [address] [place], [place] [address]  Resp Panel by RT-PCR (Flu A&B, Covid) Nasopharyngeal Swab     Status: None   Collection Time: 06/15/20  1:03 AM   Specimen: Nasopharyngeal Swab; Nasopharyngeal(NP) swabs in vial transport medium  Result Value Ref Range   SARS Coronavirus 2 by RT PCR NEGATIVE NEGATIVE    Comment: (NOTE) SARS-CoV-2 target nucleic acids are NOT DETECTED.  The SARS-CoV-2 RNA is generally detectable in upper respiratory specimens during the acute phase of infection. The lowest concentration of SARS-CoV-2 viral copies this assay can detect is 138 copies/mL. A negative result does not preclude SARS-Cov-2 infection and should not be used as the sole basis for treatment or other patient management decisions. A negative result may occur with  improper specimen collection/handling, submission of specimen other than nasopharyngeal swab, presence of viral mutation(s) within the areas targeted by this assay, and inadequate number of viral copies(<138 copies/mL). A negative  result must be combined with clinical observations, patient history, and epidemiological information. The expected result is Negative.  Fact Sheet for Patients:  BloggerCourse.comhttps://www.fda.gov/media/152166/download  Fact Sheet for Healthcare Providers:  SeriousBroker.ithttps://www.fda.gov/media/152162/download  This test is no t yet approved or cleared by the [place] FDA and  has been authorized for detection and/or diagnosis of SARS-CoV-2 by FDA under an Emergency Use Authorization (EUA). This EUA will remain  in effect (meaning this test can be used) for the duration of the COVID-19 declaration under Section 564(b)(1) of the Act, 21 U.S.C.section 360bbb-3(b)(1), unless the authorization is terminated  or revoked sooner.       Influenza A by PCR NEGATIVE NEGATIVE   Influenza B by PCR NEGATIVE NEGATIVE    Comment: (NOTE) The Xpert Xpress SARS-CoV-2/FLU/RSV plus assay is intended as an aid in the diagnosis of influenza from Nasopharyngeal swab specimens and should not be used as a sole basis for treatment. Nasal washings and aspirates are unacceptable for Xpert Xpress SARS-CoV-2/FLU/RSV testing.  Fact Sheet for Patients: BloggerCourse.comhttps://www.fda.gov/media/152166/download  Fact Sheet for Healthcare Providers: SeriousBroker.ithttps://www.fda.gov/media/152162/download  This test is not yet approved or cleared by the [place] FDA and has been authorized for detection and/or diagnosis of SARS-CoV-2 by FDA under an Emergency Use Authorization (EUA). This EUA will remain in effect (meaning this test can be used) for the duration of the COVID-19 declaration under Section 564(b)(1) of the Act, 21 U.S.C. section 360bbb-3(b)(1), unless the authorization is terminated or revoked.  Performed at [facility], [address] [place], [place] [address]     Medications:  No current facility-administered medications for this encounter.   Current Outpatient Medications  Medication Sig Dispense Refill  .  fluconazole (DIFLUCAN) 150 MG tablet Take 1 tablet by mouth once.    Marland Kitchen. HYDROcodone-acetaminophen (NORCO/VICODIN) 5-325 MG per tablet Take 1-2 tablets by mouth every 6 (six) hours as needed for pain. 30 tablet 0  . metroNIDAZOLE (METROGEL) 0.75 % vaginal gel Place 1 application vaginally at bedtime.    . phentermine (ADIPEX-P) 37.5 MG tablet Take 37.5 mg by mouth daily before breakfast.      Musculoskeletal: Strength & Muscle Tone: within normal limits Gait & Station: normal Patient leans: N/A  Psychiatric Specialty Exam: Physical Exam Vitals and nursing note reviewed.  Constitutional:      Appearance: She is well-developed.  HENT:     Head: Normocephalic.  Cardiovascular:     Rate and Rhythm: Normal rate.  Pulmonary:     Effort: Pulmonary effort is normal.  Neurological:     Mental Status: She is alert and oriented to person, place, and time.  Psychiatric:        Attention and Perception: Attention and perception normal.        Mood and Affect: Mood is depressed. Affect is tearful.        Speech: Speech normal.        Behavior: Behavior normal. Behavior is cooperative.        Thought Content: Thought content includes suicidal ideation.        Cognition and Memory: Cognition and memory normal.        Judgment: Judgment normal.     Review of Systems  Constitutional: Negative.   HENT: Negative.   Eyes: Negative.   Respiratory: Negative.   Cardiovascular:  Negative.   Gastrointestinal: Negative.   Genitourinary: Negative.   Musculoskeletal: Negative.   Skin: Negative.   Neurological: Negative.   Psychiatric/Behavioral: Positive for sleep disturbance and suicidal ideas.    Blood pressure 122/72, pulse 69, temperature 98 F (36.7 C), temperature source Oral, resp. rate 18, height 5' 2.5" (1.588 m), weight 122.5 kg, last menstrual period 05/31/2020, SpO2 100 %, unknown if currently breastfeeding.Body mass index is 48.6 kg/m.  General Appearance: Casual  Eye Contact:  Fair   Speech:  Clear and Coherent and Normal Rate  Volume:  Normal  Mood:  Depressed  Affect:  Depressed and Tearful  Thought Process:  Coherent, Goal Directed and Descriptions of Associations: Intact  Orientation:  Full (Time, Place, and Person)  Thought Content:  Logical  Suicidal Thoughts:  Yes.  without intent/plan  Homicidal Thoughts:  No  Memory:  Immediate;   Good Recent;   Good Remote;   Good  Judgement:  Fair  Insight:  Fair  Psychomotor Activity:  Normal  Concentration:  Concentration: Good and Attention Span: Good  Recall:  Good  Fund of Knowledge:  Good  Language:  Good  Akathisia:  No  Handed:  Right  AIMS (if indicated):     Assets:  Communication Skills Desire for Improvement Financial Resources/Insurance Housing Intimacy Leisure Time Physical Health Resilience Social Support Talents/Skills  ADL's:  Intact  Cognition:  WNL  Sleep:        Treatment Plan Summary: Patient reviewed with Dr. Bronwen Betters. Plan Inpatient psychiatric treatment   Recommend medications: -Zoloft 50 mg daily -Trazodone 50 mg nightly as needed/sleep  Disposition: Recommend psychiatric Inpatient admission when medically cleared. Supportive therapy provided about ongoing stressors.  This service was provided via telemedicine using a 2-way, interactive audio and video technology.  Names of all persons participating in this telemedicine service and their role in this encounter. Name: Rita Benton Role: Patient  Name: Berneice Heinrich Role: FNP  Name: Dr Bronwen Betters Role: Physician    Patrcia Dolly, FNP 06/15/2020 9:55 AM

## 2020-06-15 NOTE — ED Notes (Addendum)
Tina, TTS at bedside
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
## 2020-06-15 NOTE — ED Notes (Signed)
Pt denies offering of food or fluids at this time. Pt resting

## 2020-06-16 DIAGNOSIS — F4323 Adjustment disorder with mixed anxiety and depressed mood: Principal | ICD-10-CM

## 2020-06-16 MED ORDER — SERTRALINE HCL 50 MG PO TABS
50.0000 mg | ORAL_TABLET | Freq: Every day | ORAL | Status: DC
Start: 2020-06-16 — End: 2020-06-18
  Administered 2020-06-16 – 2020-06-18 (×3): 50 mg via ORAL
  Filled 2020-06-16 (×5): qty 1
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
## 2020-06-16 NOTE — Progress Notes (Signed)
Pt stated she was doing better    06/16/20 2200  Psych Admission Type (Psych Patients Only)  Admission Status Voluntary  Psychosocial Assessment  Patient Complaints Depression  Eye Contact Fair  Facial Expression Anxious  Affect Anxious;Depressed  Speech Logical/coherent  Interaction Assertive  Motor Activity Other (Comment) (wdl)  Appearance/Hygiene Unremarkable  Behavior Characteristics Cooperative  Mood Depressed  Thought Process  Coherency WDL  Content WDL  Delusions WDL  Perception WDL  Hallucination None reported or observed  Judgment Impaired  Confusion None  Danger to Self  Current suicidal ideation? Denies  Danger to Others  Danger to Others None reported or observed

## 2020-06-16 NOTE — Tx Team (Signed)
Interdisciplinary Treatment and Diagnostic Plan Update  06/16/2020 Time of Session: 9:00am MANA HABERL MRN: [id number]  Principal Diagnosis: <principal problem not specified>  Secondary Diagnoses: Active Problems:   Major depression   Current Medications:  Current Facility-Administered Medications  Medication Dose Route Frequency Provider Last Rate Last Admin  . alum & mag hydroxide-simeth (MAALOX/MYLANTA) 200-200-20 MG/5ML suspension 30 mL  30 mL Oral Q4H PRN Sharma Covert, MD      . famotidine (PEPCID) tablet 40 mg  40 mg Oral Daily Sharma Covert, MD   40 mg at 06/16/20 4709  . hydrOXYzine (ATARAX/VISTARIL) tablet 25 mg  25 mg Oral TID PRN Sharma Covert, MD      . ibuprofen (ADVIL) tablet 400 mg  400 mg Oral Q6H PRN Sharma Covert, MD   400 mg at 06/16/20 0811  . magnesium hydroxide (MILK OF MAGNESIA) suspension 30 mL  30 mL Oral Daily PRN Sharma Covert, MD      . traZODone (DESYREL) tablet 50 mg  50 mg Oral QHS PRN Sharma Covert, MD       PTA Medications: No medications prior to admission.    Patient Stressors: Financial difficulties Marital or family conflict Traumatic event  Patient Strengths: Ability for insight Capable of independent living Communication skills Physical Health Supportive family/friends Work skills  Treatment Modalities: Medication Management, Group therapy, Case management,  1 to 1 session with clinician, Psychoeducation, Recreational therapy.   Physician Treatment Plan for Primary Diagnosis: <principal problem not specified> Long Term Goal(s):     Short Term Goals:    Medication Management: Evaluate patient's response, side effects, and tolerance of medication regimen.  Therapeutic Interventions: 1 to 1 sessions, Unit Group sessions and Medication administration.  Evaluation of Outcomes: Not Met  Physician Treatment Plan for Secondary Diagnosis: Active Problems:   Major depression  Long Term  Goal(s):     Short Term Goals:       Medication Management: Evaluate patient's response, side effects, and tolerance of medication regimen.  Therapeutic Interventions: 1 to 1 sessions, Unit Group sessions and Medication administration.  Evaluation of Outcomes: Not Met   RN Treatment Plan for Primary Diagnosis: <principal problem not specified> Long Term Goal(s): Knowledge of disease and therapeutic regimen to maintain health will improve  Short Term Goals: Ability to remain free from injury will improve, Ability to verbalize frustration and anger appropriately will improve, Ability to identify and develop effective coping behaviors will improve and Compliance with prescribed medications will improve  Medication Management: RN will administer medications as ordered by provider, will assess and evaluate patient's response and provide education to patient for prescribed medication. RN will report any adverse and/or side effects to prescribing provider.  Therapeutic Interventions: 1 on 1 counseling sessions, Psychoeducation, Medication administration, Evaluate responses to treatment, Monitor vital signs and CBGs as ordered, Perform/monitor CIWA, COWS, AIMS and Fall Risk screenings as ordered, Perform wound care treatments as ordered.  Evaluation of Outcomes: Not Met   LCSW Treatment Plan for Primary Diagnosis: <principal problem not specified> Long Term Goal(s): Safe transition to appropriate next level of care at discharge, Engage patient in therapeutic group addressing interpersonal concerns.  Short Term Goals: Engage patient in aftercare planning with referrals and resources, Increase social support, Identify triggers associated with mental health/substance abuse issues and Increase skills for wellness and recovery  Therapeutic Interventions: Assess for all discharge needs, 1 to 1 time with Social worker, Explore available resources and support systems, Assess for adequacy in  community  support network, Educate family and significant other(s) on suicide prevention, Complete Psychosocial Assessment, Interpersonal group therapy.  Evaluation of Outcomes: Not Met   Progress in Treatment: Attending groups: Yes. Participating in groups: Yes. Taking medication as prescribed: Yes. Toleration medication: Yes. Family/Significant other contact made: No, will contact:  if consent is provided  Patient understands diagnosis: Yes. Discussing patient identified problems/goals with staff: Yes. Medical problems stabilized or resolved: Yes. Denies suicidal/homicidal ideation: Yes. Issues/concerns per patient self-inventory: No.   New problem(s) identified: No, Describe:  none  New Short Term/Long Term Goal(s): medication stabilization, elimination of SI thoughts, development of comprehensive mental wellness plan.   Patient Goals:  "To go home"  Discharge Plan or Barriers: Patient recently admitted. CSW will continue to follow and assess for appropriate referrals and possible discharge planning.   Reason for Continuation of Hospitalization: Depression Suicidal ideation  Estimated Length of Stay: 3-5 days  Attendees: Patient: Rita Benton 06/16/2020   Physician: Lala Lund, MD 06/16/2020   Nursing:  06/16/2020   RN Care Manager: 06/16/2020  Social Worker: Darletta Moll, LCSW 06/16/2020   Recreational Therapist:  06/16/2020   Other:  06/16/2020  Other:  06/16/2020   Other: 06/16/2020       Scribe for Treatment Team: Vassie Moselle, LCSW 06/16/2020 10:01 AM
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
## 2020-06-16 NOTE — Progress Notes (Signed)
Recreation Therapy Notes  Date:  12.15.21 Time: 0930 Location: [address] Dayroom  Group Topic: Stress Management  Goal Area(s) Addresses:  Patient will identify positive stress management techniques. Patient will identify benefits of using stress management post d/c.  Behavioral Response:  Engaged  Intervention: Stress Management  Activity: Meditation.  LRT played a meditation that focused on being able to self sooth when dealing with things such as anger, anxiety, depression, etc.  The meditation focused on taking the time for yourself you need to calm your emotions.    Education:  Stress Management, Discharge Planning.   Education Outcome: Acknowledges Education  Clinical Observations/Feedback: Pt attended and participated in group session.    Clorine Swing, LRT/CTRS         Cassidie Veiga A 06/16/2020 10:02 AM 
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
## 2020-06-16 NOTE — Progress Notes (Signed)
Adult Psychoeducational Group Note  Date:  06/16/2020 Time:  12:41 AM  Group Topic/Focus:  Wrap-Up Group:   The focus of this group is to help patients review their daily goal of treatment and discuss progress on daily workbooks.  Participation Level:  Active  Participation Quality:  Appropriate  Affect:  Appropriate  Cognitive:  Appropriate  Insight: Appropriate  Engagement in Group:  Engaged  Modes of Intervention:  Discussion  Additional Comments:  Pt attend wrap up group. Her day was a 5. The one positive thing that happen to her she had a great conversation with her mom today.  Charna Busman Long 06/16/2020, 12:41 AM

## 2020-06-16 NOTE — Progress Notes (Signed)
Adult Psychoeducational Group Note  Date:  06/16/2020 Time:  11:16 AM  Group Topic/Focus:  Self Esteem Action Plan:   The focus of this group is to help patients create a plan to continue to build self-esteem after discharge.  Participation Level:  Active  Participation Quality:  Appropriate  Affect:  Appropriate  Cognitive:  Alert  Insight: Appropriate  Engagement in Group:  Engaged  Modes of Intervention:  Discussion and Education  Additional Comments:  Pt was a active participant during the group session.  Geet Hosking E 06/16/2020, 11:16 AM

## 2020-06-16 NOTE — Progress Notes (Signed)
D:  Rita Benton has been up and visible on the unit.  Attending groups and interacting well with peers.  She denied SI/HI or A/V hallucinations.  She denied any pain for discomfort and appeared to be in no physical distress.  She completed her self inventory and reported her depression 7/10, hopelessness 1/10 and anxiety 2/10 (10 the worst).  Her goal for today is "focus on myself and continues to process things" and she will accomplish this goal by "pray and talk to the therapist."   A:  1:1 with RN for support and encouragement.  Medications as ordered.  Q 15 minute checks maintained for safety.  Encouraged participation in group and unit activities.   R:  Avree remains safe on the unit.  We will continue to monitor the progress towards her goals.

## 2020-06-17 NOTE — Progress Notes (Cosign Needed)
Adult Psychoeducational Group Note  Date:  06/17/2020 Time:  5:54 PM  Group Topic/Focus:  Goals Group:   The focus of this group is to help patients establish daily goals to achieve during treatment and discuss how the patient can incorporate goal setting into their daily lives to aide in recovery.  Participation Level:  Active  Participation Quality:  Appropriate  Affect:  Appropriate  Cognitive:  Alert and Appropriate  Insight: Appropriate, Good and Improving  Engagement in Group:  Engaged  Modes of Intervention:  Discussion  Additional Comments:  Pt attended group and participated in discussion.  Thayden Lemire R Toi Stelly 06/17/2020, 5:54 PM

## 2020-06-17 NOTE — [facility] Suicide Risk Assessment (Signed)
[facility] INPATIENT:  Family/Significant Other Suicide Prevention Education  Suicide Prevention Education:  Education Completed; Dolan Amen, Mother, [phone] ,  (name of family member/significant other) has been identified by the patient as the family member/significant other with whom the patient will be residing, and identified as the person(s) who will aid the patient in the event of a mental health crisis (suicidal ideations/suicide attempt).  With written consent from the patient, the family member/significant other has been provided the following suicide prevention education, prior to the and/or following the discharge of the patient.  The suicide prevention education provided includes the following:  Suicide risk factors  Suicide prevention and interventions  National Suicide Hotline telephone number  [facility] assessment telephone number  [facility] Emergency Assistance 911  [facility] and/or Residential Mobile Crisis Unit telephone number  Request made of family/significant other to:  Remove weapons (e.g., guns, rifles, knives), all items previously/currently identified as safety concern.    Remove drugs/medications (over-the-counter, prescriptions, illicit drugs), all items previously/currently identified as a safety concern.  The family member/significant other verbalizes understanding of the suicide prevention education information provided.  The family member/significant other agrees to remove the items of safety concern listed above.   Mother states she has no safety concerns and that she will be "keeping a close eye" on the pt. Mother does not believe that there are any weapons in the pt's home but will go later today and "do a sweep".  Felizardo Hoffmann 06/17/2020, 10:51 AM

## 2020-06-17 NOTE — Plan of Care (Signed)
Nurse discussed anxiety, depression and coping skills with patient.  

## 2020-06-17 NOTE — Progress Notes (Addendum)
D:  Patient's self inventory sheet, patient    sleeps good, sleep medication helpful.  Good appetite, normal energy level, good concentration.  Denied depression, hopeless and anxiety.  Denied withdrawals.  Denied SI.  Denied  physical problems.  Denied pain.  Goal is talk about himself and focus on hiimself.  Remind himself.  Plans to remind himself he is important and use resources.  Does have discharge plans.   A:  Emotional support and encouragement given patient.   Medications administered per MD orders.   R:  Denied SI and HI, contracts for safety.  Denied A/V hallucinations.  Safety maintained with 15 minute checks.

## 2020-06-17 NOTE — Progress Notes (Signed)
[facility] MD Progress Note  06/17/2020 7:24 AM Nataline TANGANYIKA BOWLDS  MRN:  [id number] Subjective:  Mrs. Rita Benton is a 34 yr old female who presents with SI and Adjustment disorder. PPHx is significant for some therapy sessions with one of her foster children.  She reports an improved mood today. She states her sleep and appetite are both good. She reports that she has gotten a lot of good out of group therapy and that she has been getting along with everyone on the unit. She reports that she spoke with Social Work yesterday and has outpatient therapy and psychiatry set up. She reports she gave Social Work permission to speak with her mother to arrange discharge. Discussed that most likely discharge would be tomorrow as long as she continues to do as well as she has been. She was excited about this plan. She reports she was a little more tired than normal yesterday. Discussed that this is a side effect of starting the medication and discussed it normal dissipates within a few days. Discussed that medication could be switched to nighttime dosing if she wanted. She decided that she is fine continuing the medication in the morning.  She reports no SI, HI, or AVH.  Principal Problem: Adjustment disorder with mixed anxiety and depressed mood Diagnosis: Principal Problem:   Adjustment disorder with mixed anxiety and depressed mood  Total Time spent with patient: 15 minutes  Past Psychiatric History: Reports no history, no previous visits with psychiatry and no psychiatric medications. Did some therapy with one foster child.  Past Medical History:  Past Medical History:  Diagnosis Date  . Fatty liver   . No pertinent past medical history   . Obese   . PCOS (polycystic ovarian syndrome)     Past Surgical History:  Procedure Laterality Date  . APPENDECTOMY    . DILATION AND CURETTAGE OF UTERUS    . DILATION AND EVACUATION N/A 08/22/2012   Procedure: DILATATION AND EVACUATION;  Surgeon: Mitchel Honour,  DO;  Location: [facility];  Service: Gynecology;  Laterality: N/A;  . KNEE SURGERY    . WISDOM TOOTH EXTRACTION     Family History:  Family History  Problem Relation Age of Onset  . Diabetes Mother   . Cancer Mother   . Hypothyroidism Mother   . Diabetes Father   . Cancer Father    Family Psychiatric  History: No Known Social History:  Social History   Substance and Sexual Activity  Alcohol Use No     Social History   Substance and Sexual Activity  Drug Use No    Social History   Socioeconomic History  . Marital status: Married    Spouse name: Not on file  . Number of children: Not on file  . Years of education: Not on file  . Highest education level: Not on file  Occupational History  . Not on file  Tobacco Use  . Smoking status: Never Smoker  . Smokeless tobacco: Never Used  Vaping Use  . Vaping Use: Never used  Substance and Sexual Activity  . Alcohol use: No  . Drug use: No  . Sexual activity: Yes    Birth control/protection: None  Other Topics Concern  . Not on file  Social History Narrative  . Not on file   Social Determinants of Health   Financial Resource Strain: Not on file  Food Insecurity: Not on file  Transportation Needs: Not on file  Physical Activity: Not on file  Stress: Not on  file  Social Connections: Not on file   Additional Social History:                         Sleep: Good  Appetite:  Good  Current Medications: Current Facility-Administered Medications  Medication Dose Route Frequency Provider Last Rate Last Admin  . alum & mag hydroxide-simeth (MAALOX/MYLANTA) 200-200-20 MG/5ML suspension 30 mL  30 mL Oral Q4H PRN Antonieta Pert, MD      . famotidine (PEPCID) tablet 40 mg  40 mg Oral Daily Antonieta Pert, MD   40 mg at 06/16/20 9937  . hydrOXYzine (ATARAX/VISTARIL) tablet 25 mg  25 mg Oral TID PRN Antonieta Pert, MD   25 mg at 06/16/20 2111  . ibuprofen (ADVIL) tablet 400 mg  400 mg Oral Q6H PRN Antonieta Pert, MD   400 mg at 06/16/20 0811  . magnesium hydroxide (MILK OF MAGNESIA) suspension 30 mL  30 mL Oral Daily PRN Antonieta Pert, MD      . sertraline (ZOLOFT) tablet 50 mg  50 mg Oral Daily Lauro Franklin, MD   50 mg at 06/16/20 1321  . traZODone (DESYREL) tablet 50 mg  50 mg Oral QHS PRN Antonieta Pert, MD   50 mg at 06/16/20 2111    Lab Results:  Results for orders placed or performed during the hospital encounter of 06/15/20 (from the past 48 hour(s))  TSH     Status: Abnormal   Collection Time: 06/15/20  6:51 PM  Result Value Ref Range   TSH 4.652 (H) 0.350 - 4.500 uIU/mL    Comment: Performed by a 3rd Generation assay with a functional sensitivity of <=0.01 uIU/mL. Performed at [facility], [address], [place], [place] [address]   Hemoglobin A1c     Status: None   Collection Time: 06/15/20  6:51 PM  Result Value Ref Range   Hgb A1c MFr Bld 5.3 4.8 - 5.6 %    Comment: (NOTE) Pre diabetes:          5.7%-6.4%  Diabetes:              >6.4%  Glycemic control for   <7.0% adults with diabetes    Mean Plasma Glucose 105.41 mg/dL    Comment: Performed at [facility], [address] [place], [place] [address]    Blood Alcohol level:  Lab Results  Component Value Date   ETH <10 06/14/2020    Metabolic Disorder Labs: Lab Results  Component Value Date   HGBA1C 5.3 06/15/2020   MPG 105.41 06/15/2020   No results found for: PROLACTIN No results found for: CHOL, TRIG, HDL, CHOLHDL, VLDL, LDLCALC  Physical Findings: AIMS: Facial and Oral Movements Muscles of Facial Expression: None, normal Lips and Perioral Area: None, normal Jaw: None, normal Tongue: None, normal,Extremity Movements Upper (arms, wrists, hands, fingers): None, normal Lower (legs, knees, ankles, toes): None, normal, Trunk Movements Neck, shoulders, hips: None, normal, Overall Severity Severity of abnormal movements (highest score from questions above):  None, normal Incapacitation due to abnormal movements: None, normal Patient's awareness of abnormal movements (rate only patient's report): No Awareness, Dental Status Current problems with teeth and/or dentures?: No Does patient usually wear dentures?: No  CIWA:    COWS:     Musculoskeletal: Strength & Muscle Tone: within normal limits Gait & Station: normal Patient leans: N/A  Psychiatric Specialty Exam: Physical Exam Vitals and nursing note reviewed.  Constitutional:      General: She is not in acute distress.    Appearance: Normal appearance. She is obese. She is not ill-appearing, toxic-appearing or diaphoretic.  HENT:     Head: Normocephalic and atraumatic.  Cardiovascular:     Rate and Rhythm: Normal rate.  Pulmonary:     Effort: Pulmonary effort is normal.  Musculoskeletal:        General: Normal range of motion.  Neurological:     General: No focal deficit present.     Mental Status: She is alert.     Review of Systems  Constitutional: Negative for fatigue and fever.  Respiratory: Negative for chest tightness and shortness of breath.   Cardiovascular: Negative for chest pain and palpitations.  Gastrointestinal: Negative for abdominal pain, constipation, diarrhea, nausea and vomiting.  Neurological: Negative for dizziness, weakness, light-headedness and headaches.  Psychiatric/Behavioral: Negative for agitation, behavioral problems, self-injury, sleep disturbance and suicidal ideas. The patient is not nervous/anxious.     Blood pressure 118/83, pulse (!) 102, temperature 97.9 F (36.6 C), temperature source Oral, resp. rate 16, height 5\' 2"  (1.575 m), weight 122.5 kg, last menstrual period 05/31/2020, SpO2 99 %, unknown if currently breastfeeding.Body mass index is 49.38 kg/m.  General Appearance: Fairly Groomed  Eye Contact:  Good  Speech:  Clear and Coherent and Normal Rate  Volume:  Normal  Mood:  Appropriate  Affect:  Appropriate  Thought Process:   Coherent and Goal Directed  Orientation:  Full (Time, Place, and Person)  Thought Content:  Logical  Suicidal Thoughts:  No  Homicidal Thoughts:  No  Memory:  Immediate;   Fair Recent;   Fair  Judgement:  Fair  Insight:  Good  Psychomotor Activity:  Normal  Concentration:  Concentration: Good and Attention Span: Good  Recall:  Good  Fund of Knowledge:  Good  Language:  Good  Akathisia:  Negative  Handed:  Right  AIMS (if indicated):     Assets:  Communication Skills Desire for Improvement Resilience Social Support  ADL's:  Intact  Cognition:  WNL  Sleep:  Number of Hours: 6.25     Treatment Plan Summary: Daily contact with patient to assess and evaluate symptoms and progress in treatment Only mild tiredness reported. Reports doing well on medication. Social Work has established follow up with outpatient psychiatrist and therapist. She has done well in group and interacts well with everyone on the unit. Will plan to discharge tomorrow. Encouraged continued group therapy attendance. Will continue to monitor. Will plan for discharge tomorrow.   -Continue Zoloft 50 mg daily  -Continue Pepcid 40 mg daily -Continue PRN's: Maalox, Atarax, Advil, Milk of Magnesia, Trazodone   06/02/2020, MD 06/17/2020, 7:24 AM

## 2020-06-18 MED ORDER — SERTRALINE HCL 50 MG PO TABS: 50.0000 mg | ORAL_TABLET | Freq: Every day | ORAL | 0 refills | Status: AC

## 2020-06-18 MED ORDER — SERTRALINE HCL 50 MG PO TABS: 50.0000 mg | ORAL_TABLET | Freq: Every day | ORAL | 0 refills | Status: DC

## 2020-06-18 NOTE — Progress Notes (Signed)
[facility] Group Notes:  (Nursing/MHT/Case Management/Adjunct)  Date:  06/17/2020  Time:  2100 Type of Therapy:  wrap up group  Participation Level:  Active  Participation Quality:  Appropriate, Attentive, Sharing and Supportive  Affect:  Appropriate  Cognitive:  Appropriate  Insight:  Improving  Engagement in Group:  Engaged  Modes of Intervention:  Clarification, Education and Support  Summary of Progress/Problems: Positive thinking and positive change were discussed.   Rita Benton S 06/18/2020, 12:09 AM

## 2020-06-18 NOTE — Progress Notes (Signed)
  [facility] Case [facility] Discharge Plan :  Will you be returning to the same living situation after discharge:  Yes,  personal home At discharge, do you have transportation home?: Yes,  via [employer] Do you have the ability to pay for your medications: Yes,  has employment  Release of information consent forms completed and in the chart;  Patient's signature needed at discharge.  Patient to Follow up at:  [facility]up Information    [facility] Go on 06/21/2020.   Why: You have a hospital follow-up appointment for therapy and medication management services on 06/21/20 at 8:30 am.  This appointment will be held in person. Contact information: [address] [place] [address] [phone]               Next level of care provider has access to [facility] Link:no  Safety Planning and Suicide Prevention discussed: Terence Lux, Mother, [phone]  Have you used any form of tobacco in the last 30 days? (Cigarettes, Smokeless Tobacco, Cigars, and/or Pipes): No  Has patient been referred to the Quitline?: N/A patient is not a smoker  Patient has been referred for addiction treatment: N/A  Felizardo Hoffmann, Theresia Majors 06/18/2020, 9:31 AM

## 2020-06-18 NOTE — Progress Notes (Signed)
Discharge Note:  Patient denies SI/HI AVH at this time. Discharge instructions, AVS, prescriptions and transition record gone over with patient. Patient agrees to comply with medication management, follow-up visit, and outpatient therapy. Patient belongings returned to patient. Patient questions and concerns addressed and answered.  Patient ambulatory off unit.  Patient discharged to home.   
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
## 2020-06-18 NOTE — B[facility] Suicide Risk Assessment (Signed)
[facility] Discharge Suicide Risk Assessment   Principal Problem: Adjustment disorder with mixed anxiety and depressed mood Discharge Diagnoses: Principal Problem:   Adjustment disorder with mixed anxiety and depressed mood   Total Time spent with patient: 20 minutes  Musculoskeletal: Strength & Muscle Tone: within normal limits Gait & Station: normal Patient leans: N/A  Psychiatric Specialty Exam: Review of Systems  Blood pressure (!) 133/93, pulse 90, temperature 97.8 F (36.6 C), temperature source Oral, resp. rate 16, height 5\' 2"  (1.575 m), weight 122.5 kg, last menstrual period 05/31/2020, SpO2 100 %, unknown if currently breastfeeding.Body mass index is 49.38 kg/m.  General Appearance: Casual  Eye Contact::  Fair  Speech:  Clear and Coherent409  Volume:  Normal  Mood:  Euthymic  Affect:  Appropriate  Thought Process:  Coherent  Orientation:  Full (Time, Place, and Person)  Thought Content:  Logical  Suicidal Thoughts:  No  Homicidal Thoughts:  No  Memory:  Recent;   Fair  Judgement:  Fair  Insight:  Fair  Psychomotor Activity:  Normal  Concentration:  Fair  Recall:  [id number] of Knowledge:Fair  Language: Fair  Akathisia:  No  Handed:  Right  AIMS (if indicated):     Assets:  Communication Skills Desire for Improvement Housing Intimacy Leisure Time Physical Health  Sleep:  Number of Hours: 6.25  Cognition: WNL  ADL's:  Intact   Mental Status Per Nursing Assessment::   On Admission:  Suicidal ideation indicated by patient,Self-harm thoughts  Demographic Factors:  Caucasian and Low socioeconomic status  Loss Factors: Loss of significant relationship  Historical Factors: Impulsivity  Risk Reduction Factors:   Sense of responsibility to family, Positive social support and Positive therapeutic relationship  Continued Clinical Symptoms:  Previous Psychiatric Diagnoses and Treatments  Cognitive Features That Contribute To Risk:  None    Suicide Risk:   Mild:  Suicidal ideation of limited frequency, intensity, duration, and specificity.  There are no identifiable plans, no associated intent, mild dysphoria and related symptoms, good self-control (both objective and subjective assessment), few other risk factors, and identifiable protective factors, including available and accessible social support.   Follow-up Information    Inc, [phone]. Go on 06/21/2020.   Why: You have a hospital follow-up appointment for therapy and medication management services on 06/21/20 at 8:30 am.  This appointment will be held in person. Contact information: [address] [place] [phone]               Plan Of Care/Follow-up recommendations:  Other:  Follow-up with outpatient care  [phone], MD 06/18/2020, 9:57 AM

## 2020-06-18 NOTE — Progress Notes (Signed)
Patient cooperative with treatment. Denies SI and HI, contracts for safety.  Denied A/V hallucinations. She seemed to sleep well through out the night.

## 2020-06-18 NOTE — Progress Notes (Signed)
Recreation Therapy Notes  Date:  12.17.21 Time: 0930 Location: 300 Hall Group Room  Group Topic: Stress Management  Goal Area(s) Addresses:  Patient will identify positive stress management techniques. Patient will identify benefits of using stress management post d/c.  Behavioral Response: Engaged  Intervention: Stress Management  Activity: Progressive Muscle Relaxation.  LRT led group in progressive muscle relaxation.  Patients were to tense their muscles but not to the point of strain and then release the tension.  This was done for each muscle group individually. If patients had any areas that were sore or injured they could skip those affected areas.  Patients were to listen and follow along as LRT read script.    Education:  Stress Management, Discharge Planning.   Education Outcome: Acknowledges Education  Clinical Observations/Feedback: Pt attended and participated in group activity.     Tanyah Debruyne, LRT/CTRS         Rita Benton A 06/18/2020 10:18 AM 

## 2020-06-18 NOTE — [facility] Group Notes (Signed)
Pt did not attend morning goals group. 

## 2021-04-28 ENCOUNTER — Other Ambulatory Visit: Payer: Self-pay

## 2021-04-28 ENCOUNTER — Emergency Department (HOSPITAL_BASED_OUTPATIENT_CLINIC_OR_DEPARTMENT_OTHER)
Admission: EM | Admit: 2021-04-28 | Discharge: 2021-04-28 | Disposition: A | Discharge status: Home / Self Care | Payer: Medicaid Other | Attending: Emergency Medicine | Admitting: Emergency Medicine

## 2021-04-28 ENCOUNTER — Encounter (HOSPITAL_BASED_OUTPATIENT_CLINIC_OR_DEPARTMENT_OTHER): Payer: Self-pay | Admitting: *Deleted

## 2021-04-28 DIAGNOSIS — R079 Chest pain, unspecified: Secondary | ICD-10-CM | POA: Diagnosis not present

## 2021-04-28 DIAGNOSIS — J069 Acute upper respiratory infection, unspecified: Secondary | ICD-10-CM

## 2021-04-28 DIAGNOSIS — Z3A08 8 weeks gestation of pregnancy: Secondary | ICD-10-CM | POA: Diagnosis not present

## 2021-04-28 DIAGNOSIS — O99511 Diseases of the respiratory system complicating pregnancy, first trimester: Secondary | ICD-10-CM | POA: Insufficient documentation

## 2021-04-28 DIAGNOSIS — O09891 Supervision of other high risk pregnancies, first trimester: Secondary | ICD-10-CM | POA: Insufficient documentation

## 2021-04-28 MED ORDER — HYDROCOD POLST-CPM POLST ER 10-8 MG/5ML PO SUER: 5.0000 mL | Freq: Two times a day (BID) | ORAL | 0 refills | Status: AC | PRN

## 2021-04-28 MED ORDER — HYDROCOD POLST-CPM POLST ER 10-8 MG/5ML PO SUER
5.0000 mL | Freq: Once | ORAL | Status: AC
  Administered 2021-04-28: 5 mL via ORAL
  Filled 2021-04-28: qty 5

## 2021-04-28 NOTE — ED Triage Notes (Signed)
Pt c/o sore throat with some chest discomfort that woke her up around 0330; pt is pregnant and limited on what she can take;
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
## 2021-10-23 DIAGNOSIS — R9431 Abnormal electrocardiogram [ECG] [EKG]: Secondary | ICD-10-CM

## 2021-10-23 DIAGNOSIS — R001 Bradycardia, unspecified: Secondary | ICD-10-CM | POA: Diagnosis not present
